# Patient Record
Sex: Male | Born: 1935 | Race: White | Hispanic: No | Smoking: Former smoker
Health system: Southern US, Community
[De-identification: ages and names within clinical notes are randomized; demographics above are authoritative.]

## PROBLEM LIST (undated history)

## (undated) DIAGNOSIS — F32A Depression, unspecified: Secondary | ICD-10-CM

## (undated) DIAGNOSIS — N200 Calculus of kidney: Secondary | ICD-10-CM

## (undated) DIAGNOSIS — C3491 Malignant neoplasm of unspecified part of right bronchus or lung: Principal | ICD-10-CM

## (undated) DIAGNOSIS — M1990 Unspecified osteoarthritis, unspecified site: Secondary | ICD-10-CM

## (undated) DIAGNOSIS — I10 Essential (primary) hypertension: Secondary | ICD-10-CM

## (undated) DIAGNOSIS — F419 Anxiety disorder, unspecified: Secondary | ICD-10-CM

## (undated) DIAGNOSIS — F329 Major depressive disorder, single episode, unspecified: Secondary | ICD-10-CM

## (undated) HISTORY — DX: Calculus of kidney: N20.0

## (undated) HISTORY — DX: Depression, unspecified: F32.A

## (undated) HISTORY — DX: Major depressive disorder, single episode, unspecified: F32.9

## (undated) HISTORY — DX: Malignant neoplasm of unspecified part of right bronchus or lung: C34.91

## (undated) HISTORY — DX: Essential (primary) hypertension: I10

## (undated) HISTORY — DX: Unspecified osteoarthritis, unspecified site: M19.90

## (undated) HISTORY — DX: Anxiety disorder, unspecified: F41.9

## (undated) HISTORY — PX: HERNIA REPAIR: SHX51

---

## 2017-02-11 ENCOUNTER — Other Ambulatory Visit: Payer: Self-pay | Admitting: Medical Oncology

## 2017-02-11 DIAGNOSIS — C3490 Malignant neoplasm of unspecified part of unspecified bronchus or lung: Secondary | ICD-10-CM

## 2017-02-12 ENCOUNTER — Other Ambulatory Visit (HOSPITAL_BASED_OUTPATIENT_CLINIC_OR_DEPARTMENT_OTHER): Payer: Medicare Other

## 2017-02-12 ENCOUNTER — Ambulatory Visit: Payer: Self-pay

## 2017-02-12 ENCOUNTER — Encounter: Payer: Self-pay | Admitting: *Deleted

## 2017-02-12 ENCOUNTER — Ambulatory Visit (HOSPITAL_BASED_OUTPATIENT_CLINIC_OR_DEPARTMENT_OTHER): Payer: Medicare Other | Admitting: Internal Medicine

## 2017-02-12 ENCOUNTER — Encounter: Payer: Self-pay | Admitting: Internal Medicine

## 2017-02-12 ENCOUNTER — Telehealth: Payer: Self-pay | Admitting: Internal Medicine

## 2017-02-12 DIAGNOSIS — J910 Malignant pleural effusion: Secondary | ICD-10-CM

## 2017-02-12 DIAGNOSIS — C3491 Malignant neoplasm of unspecified part of right bronchus or lung: Secondary | ICD-10-CM | POA: Diagnosis present

## 2017-02-12 DIAGNOSIS — C3490 Malignant neoplasm of unspecified part of unspecified bronchus or lung: Secondary | ICD-10-CM

## 2017-02-12 DIAGNOSIS — I10 Essential (primary) hypertension: Secondary | ICD-10-CM

## 2017-02-12 DIAGNOSIS — C3480 Malignant neoplasm of overlapping sites of unspecified bronchus and lung: Secondary | ICD-10-CM

## 2017-02-12 DIAGNOSIS — C7951 Secondary malignant neoplasm of bone: Secondary | ICD-10-CM

## 2017-02-12 HISTORY — DX: Malignant neoplasm of unspecified part of right bronchus or lung: C34.91

## 2017-02-12 LAB — CBC WITH DIFFERENTIAL/PLATELET
BASO%: 0.2 % (ref 0.0–2.0)
BASOS ABS: 0 10*3/uL (ref 0.0–0.1)
EOS ABS: 0.2 10*3/uL (ref 0.0–0.5)
EOS%: 2.5 % (ref 0.0–7.0)
HEMATOCRIT: 39.5 % (ref 38.4–49.9)
HEMOGLOBIN: 12.8 g/dL — AB (ref 13.0–17.1)
LYMPH#: 2.8 10*3/uL (ref 0.9–3.3)
LYMPH%: 44.2 % (ref 14.0–49.0)
MCH: 30.3 pg (ref 27.2–33.4)
MCHC: 32.4 g/dL (ref 32.0–36.0)
MCV: 93.6 fL (ref 79.3–98.0)
MONO#: 0.6 10*3/uL (ref 0.1–0.9)
MONO%: 9.5 % (ref 0.0–14.0)
NEUT%: 43.6 % (ref 39.0–75.0)
NEUTROS ABS: 2.8 10*3/uL (ref 1.5–6.5)
Platelets: 221 10*3/uL (ref 140–400)
RBC: 4.22 10*6/uL (ref 4.20–5.82)
RDW: 14 % (ref 11.0–14.6)
WBC: 6.4 10*3/uL (ref 4.0–10.3)

## 2017-02-12 LAB — COMPREHENSIVE METABOLIC PANEL
ALBUMIN: 3.3 g/dL — AB (ref 3.5–5.0)
ALK PHOS: 108 U/L (ref 40–150)
ALT: 11 U/L (ref 0–55)
ANION GAP: 8 meq/L (ref 3–11)
AST: 18 U/L (ref 5–34)
BUN: 24.2 mg/dL (ref 7.0–26.0)
CALCIUM: 9.4 mg/dL (ref 8.4–10.4)
CO2: 28 mEq/L (ref 22–29)
CREATININE: 1.1 mg/dL (ref 0.7–1.3)
Chloride: 103 mEq/L (ref 98–109)
EGFR: 57 mL/min/{1.73_m2} — ABNORMAL LOW (ref >90)
Glucose: 93 mg/dl (ref 70–140)
POTASSIUM: 4.8 meq/L (ref 3.5–5.1)
Sodium: 138 mEq/L (ref 136–145)
Total Bilirubin: 0.48 mg/dL (ref 0.20–1.20)
Total Protein: 7.3 g/dL (ref 6.4–8.3)

## 2017-02-12 NOTE — Telephone Encounter (Signed)
Gave patient AVS and calendar of upcoming September appointment.

## 2017-02-12 NOTE — Progress Notes (Signed)
[place] [phone]   [phone]  CONSULT NOTE  REFERRING PHYSICIAN: Dr. Mardella Layman  REASON FOR CONSULTATION:  83 years old white male recently diagnosed with lung cancer.  HPI Carlos Bennett is a 83 y.o. male with past medical history significant for hypertension, anxiety/depression, kidney stone, hernia repair as well as a remote history of smoking for a 3 years but quit in 1970s. The patient presented to the [facility] in [facility] complaining of right flank pain. He had CT scan of the abdomen performed on 01/29/2017 and it showed massive right pleural effusion with complete collapse of the visualized portion of the right lung base. There was also innumerable lytic lesions seen throughout the lumbar spine as well as the lower thoracic spine. Lytic lesions were also seen in the iliac bones bilaterally, the right superior pubic ramus and proximal femurs bilaterally. On 01/31/2017 the patient underwent ultrasound-guided right thoracentesis with drainage of 2.2 L of pleural fluid. The final cytology from [facility] pathology department [phone]) showed adenocarcinoma with immunohistochemical findings suggesting origin from a primary lung neoplasm. I'm not sure if any medical or studies were performed on this specimen. CT scan of the chest was performed on 01/31/2017 and it showed small to moderate loculated right pleural effusion with significant improvement since the previous study on 01/29/2017. There was persistent right lower lobe and scattered right lung atelectasis. The scan also showed lytic bony metastases with cortical destruction present within the lower thoracic spine primarily involving T10 and T11 which include soft tissue mass extending posteriorly into the central canal worse at T10. There was also tiny left lung pulmonary nodules measuring 0.4 cm within the sub-pleural left lower lobe. The patient moved to  [facility] to be close to his daughter and he was kindly referred to me for evaluation and discussion of his treatment options. When seen today he continues to have pain in the right lower side of the chest with mild cough productive of whitish sputum but no significant shortness of breath or hemoptysis. He lost around 25 pounds in the last 6 months. He denied having any nausea, vomiting, diarrhea but has constipation. He denied having any fever or chills. The patient denied having any headaches or visual changes. Family history is unknown regarding his father and mother. He has a sister with heart disease and a daughter died from ovarian cancer. The patient is legally separated. He was accompanied today by his daughter, Carlos Bennett who lives in [place]. His son Carlos Bennett lives in [place] and he has the healthcare power of attorney but he also mentions that his sister, Carlos Bennett called neck decision if needed. The patient is currently retired and used to be recommended [employer]. He has a history of smoking for only around 3 years but quit in 1970. He has no history of alcohol or drug abuse.  HPI  Past Medical History:  Diagnosis Date  . Anxiety   . Arthritis   . Depression   . Hypertension   . Kidney stone     Past Surgical History:  Procedure Laterality Date  . HERNIA REPAIR N/A     History reviewed. No pertinent family history.  Social History Social History  Substance Use Topics  . Smoking status: Former Smoker    Packs/day: 1.00    Years: 10.00    Types: Cigarettes  . Smokeless tobacco: Never Used  . Alcohol use Not on file    No Known Allergies  Current  Outpatient Prescriptions  Medication Sig Dispense Refill  . acetaminophen (TYLENOL) 325 MG tablet Take 650 mg by mouth every 6 (six) hours as needed.    Marland Kitchen albuterol (PROVENTIL HFA;VENTOLIN HFA) 108 (90 Base) MCG/ACT inhaler Inhale into the lungs every 6 (six) hours as needed for wheezing or shortness of breath.    Marland Kitchen buPROPion  (ZYBAN) 150 MG 12 hr tablet Take 150 mg by mouth daily.    . hydrALAZINE (APRESOLINE) 25 MG tablet Take 25 mg by mouth 2 (two) times daily.    Marland Kitchen HYDROcodone-acetaminophen (NORCO) 5-325 MG tablet Take 1 tablet by mouth every 4 (four) hours as needed for moderate pain.    Marland Kitchen losartan (COZAAR) 50 MG tablet Take 50 mg by mouth daily.    . mirtazapine (REMERON) 15 MG tablet Take 15 mg by mouth at bedtime.    . diclofenac sodium (VOLTAREN) 1 % GEL Apply 2 g topically 4 (four) times daily.    . hydrocortisone 2.5 % cream Apply topically 2 (two) times daily.     No current facility-administered medications for this visit.     Review of Systems  Constitutional: positive for fatigue and weight loss Eyes: negative Ears, nose, mouth, throat, and face: positive for hearing loss Respiratory: positive for cough and pleurisy/chest pain Cardiovascular: negative Gastrointestinal: negative Genitourinary:negative Integument/breast: negative Hematologic/lymphatic: negative Musculoskeletal:negative Neurological: negative Behavioral/Psych: negative Endocrine: negative Allergic/Immunologic: negative  Physical Exam  FIE:PPIRJ, healthy, no distress, well nourished and well developed SKIN: skin color, texture, turgor are normal, no rashes or significant lesions HEAD: Normocephalic, No masses, lesions, tenderness or abnormalities EYES: normal, PERRLA, Conjunctiva are pink and non-injected EARS: External ears normal, Canals clear OROPHARYNX:no exudate, no erythema and lips, buccal mucosa, and tongue normal  NECK: supple, no adenopathy, no JVD LYMPH:  no palpable lymphadenopathy, no hepatosplenomegaly LUNGS: decreased breath sounds HEART: regular rate & rhythm, no murmurs and no gallops ABDOMEN:abdomen soft, non-tender, normal bowel sounds and no masses or organomegaly BACK: Back symmetric, no curvature., No CVA tenderness EXTREMITIES:no joint deformities, effusion, or inflammation, no edema, no skin  discoloration  NEURO: alert & oriented x 3 with fluent speech, no focal motor/sensory deficits  PERFORMANCE STATUS: ECOG 1  LABORATORY DATA: Lab Results  Component Value Date   WBC 6.4 02/12/2017   HGB 12.8 (L) 02/12/2017   HCT 39.5 02/12/2017   MCV 93.6 02/12/2017   PLT 221 02/12/2017      Chemistry      Component Value Date/Time   NA 138 02/12/2017 1341   K 4.8 02/12/2017 1341   CO2 28 02/12/2017 1341   BUN 24.2 02/12/2017 1341   CREATININE 1.1 02/12/2017 1341      Component Value Date/Time   CALCIUM 9.4 02/12/2017 1341   ALKPHOS 108 02/12/2017 1341   AST 18 02/12/2017 1341   ALT 11 02/12/2017 1341   BILITOT 0.48 02/12/2017 1341       RADIOGRAPHIC STUDIES: No results found.  ASSESSMENT:This is a very pleasant 83 years old white male recently diagnosed with a stage IV (T1a, NX, M1b) non-small cell lung cancer, adenocarcinoma presented with large right malignant pleural effusion in addition to pulmonary nodules and metastatic bone disease diagnosed in August 2018.  PLAN: I have a lengthy discussion with the patient and his daughter as well as his son who was available by phone on his current disease stage, prognosis and treatment options. I personally and independently reviewed the scan images that came from the outside facilities as well as a previous records.  I recommended for the patient to complete the staging workup by ordering a PET scan as well as MRI of the brain to rule out any other metastatic disease. I also recommend for the patient to see cardiothoracic surgery for consideration of Pleurx catheter placement for drainage of the recurrent right malignant pleural effusion and also to have available tissue for molecular studies. I referred the patient to radiation oncology for consideration of palliative radiotherapy to the metastatic disease at T10 and T11 that is extending into the spinal canal. I also requested a blood test for EGFR mutation today. I will  arrange for the patient to come back for follow-up visit in 3 weeks for reevaluation and more detailed discussion about his treatment options. I think the patient would be a good candidate for treatment with targeted therapy or immunotherapy if he has the appropriate markers but he may not be great candidate for chemotherapy if it is the only option. The patient and his family agreed to the current plan. He was advised to call immediately if he has any concerning symptoms in the interval. The patient voices understanding of current disease status and treatment options and is in agreement with the current care plan. All questions were answered. The patient knows to call the clinic with any problems, questions or concerns. We can certainly see the patient much sooner if necessary.  Thank you so much for allowing me to participate in the care of Carlos Bennett. I will continue to follow up the patient with you and assist in his care.  I spent 55 minutes counseling the patient face to face. The total time spent in the appointment was 80 minutes.  Disclaimer: This note was dictated with voice recognition software. Similar sounding words can inadvertently be transcribed and may not be corrected upon review.   Eilleen Kempf February 12, 2017, 2:39 PM

## 2017-02-13 ENCOUNTER — Encounter: Payer: Self-pay | Admitting: Radiation Oncology

## 2017-02-18 ENCOUNTER — Other Ambulatory Visit: Payer: Self-pay | Admitting: Internal Medicine

## 2017-02-18 ENCOUNTER — Inpatient Hospital Stay
Admission: RE | Admit: 2017-02-18 | Discharge: 2017-02-18 | Disposition: A | Discharge status: Home / Self Care | Payer: Self-pay | Source: Ambulatory Visit | Attending: Internal Medicine | Admitting: Internal Medicine

## 2017-02-18 ENCOUNTER — Ambulatory Visit
Admission: RE | Admit: 2017-02-18 | Discharge: 2017-02-18 | Disposition: A | Discharge status: Home / Self Care | Payer: Self-pay | Source: Ambulatory Visit | Attending: Internal Medicine | Admitting: Internal Medicine

## 2017-02-18 ENCOUNTER — Encounter: Payer: Self-pay | Admitting: *Deleted

## 2017-02-18 ENCOUNTER — Other Ambulatory Visit: Payer: Self-pay | Admitting: *Deleted

## 2017-02-18 ENCOUNTER — Telehealth: Payer: Self-pay | Admitting: *Deleted

## 2017-02-18 ENCOUNTER — Encounter: Payer: Self-pay | Admitting: Radiation Oncology

## 2017-02-18 DIAGNOSIS — C7951 Secondary malignant neoplasm of bone: Secondary | ICD-10-CM | POA: Insufficient documentation

## 2017-02-18 DIAGNOSIS — C801 Malignant (primary) neoplasm, unspecified: Secondary | ICD-10-CM

## 2017-02-18 LAB — EPIDERMAL GROWTH FACTOR RECEPTOR (EGFR) MUTATION ANALYSIS

## 2017-02-18 MED ORDER — HYDROCODONE-ACETAMINOPHEN 5-325 MG PO TABS: 1.0000 | ORAL_TABLET | ORAL | 0 refills | Status: DC | PRN

## 2017-02-18 NOTE — Progress Notes (Signed)
Oncology Nurse Navigator Documentation  Oncology Nurse Navigator Flowsheets 02/18/2017  Navigator Location [facility]  Navigator Encounter Type Other/Took CD's took radiology to be uploaded in PACS  Treatment Phase Pre-Tx/Tx Discussion  Barriers/Navigation Needs Coordination of Care  Interventions Coordination of Care  Coordination of Care Other  Acuity Level 2  Acuity Level 2 Other

## 2017-02-18 NOTE — Telephone Encounter (Signed)
Called [facility] for Carlos Bennett, Rx for pain medicine can be picked up tomorrow after 0830.

## 2017-02-18 NOTE — Progress Notes (Signed)
Histology and Location of Primary Cancer: stage IV (T1a, NX, M1b) non-small cell lung cancer, adenocarcinoma presented with large right malignant pleural effusion in addition to pulmonary nodules and metastatic bone disease diagnosed in August 2018.  Location(s) of Symptomatic Metastases: T10 and T11  Past/Anticipated chemotherapy by medical oncology, if any:  Ordered MRI of brain and PET to be done 02/26/17. Referred to cardiothoracic surgery for consideration of Pleurx catheter. Julien Nordmann believes the patient would be a good candidate for treatment with targeted therapy or immunotherapy if he has the appropriate markers but he may not be great candidate for chemotherapy if it is the only option.  Pain on a scale of 0-10 is: Right lower side of chest which radiates to center of abdomen 6-10 on a scale of 0-10 reports taking 1/2 - 1 tablet of norco q4h to manage pain   If Spine Met(s), symptoms, if any, include:  Bowel/Bladder retention or incontinence (please describe): Reports bowel incontinence last night but, normally constipation.  Numbness or weakness in extremities (please describe): Reports intermittent numbness in hands  Current Decadron regimen, if applicable: denies  Ambulatory status? Walker? Wheelchair?: Ambulatory  SAFETY ISSUES:  Prior radiation? no  Pacemaker/ICD? no  Possible current pregnancy? no  Is the patient on methotrexate? no  Current Complaints / other details:  83 year old male. Moved to [place] from [place] to be closer to his daughter.   Reports a mild cough productive with thick, sticky, white sputum. Denies significant SOB. Denies hemoptysis. Reports he has lost 25 lbs in 6 months. Daughter reports he is now gaining weight and eating more. Reports he is occasionally off balance. Reports constipation.  Scheduled to consult with Roxan Hockey about draining right pleural effusion on 03/04/2017.

## 2017-02-18 NOTE — Telephone Encounter (Signed)
ok 

## 2017-02-18 NOTE — Telephone Encounter (Signed)
Patient's daughter [name] called requesting refill for pain medication.  Return number when ready for pick up is [phone]).

## 2017-02-19 ENCOUNTER — Ambulatory Visit
Admission: RE | Admit: 2017-02-19 | Discharge: 2017-02-19 | Disposition: A | Discharge status: Home / Self Care | Payer: Medicare Other | Source: Ambulatory Visit | Attending: Radiation Oncology | Admitting: Radiation Oncology

## 2017-02-19 ENCOUNTER — Ambulatory Visit
Admission: RE | Admit: 2017-02-19 | Discharge: 2017-02-19 | Disposition: A | Discharge status: Home / Self Care | Payer: Self-pay | Source: Ambulatory Visit | Attending: Internal Medicine | Admitting: Internal Medicine

## 2017-02-19 ENCOUNTER — Encounter: Payer: Self-pay | Admitting: Radiation Oncology

## 2017-02-19 ENCOUNTER — Other Ambulatory Visit: Payer: Self-pay | Admitting: Internal Medicine

## 2017-02-19 ENCOUNTER — Other Ambulatory Visit: Payer: Self-pay | Admitting: Medical Oncology

## 2017-02-19 VITALS — BP 144/66 | HR 79 | Temp 97.9°F | Resp 16 | Ht 66.5 in | Wt 132.4 lb

## 2017-02-19 DIAGNOSIS — F329 Major depressive disorder, single episode, unspecified: Secondary | ICD-10-CM | POA: Insufficient documentation

## 2017-02-19 DIAGNOSIS — Z87891 Personal history of nicotine dependence: Secondary | ICD-10-CM | POA: Insufficient documentation

## 2017-02-19 DIAGNOSIS — Z79899 Other long term (current) drug therapy: Secondary | ICD-10-CM | POA: Insufficient documentation

## 2017-02-19 DIAGNOSIS — I10 Essential (primary) hypertension: Secondary | ICD-10-CM | POA: Insufficient documentation

## 2017-02-19 DIAGNOSIS — C3491 Malignant neoplasm of unspecified part of right bronchus or lung: Secondary | ICD-10-CM | POA: Diagnosis not present

## 2017-02-19 DIAGNOSIS — Z87442 Personal history of urinary calculi: Secondary | ICD-10-CM | POA: Diagnosis not present

## 2017-02-19 DIAGNOSIS — C7951 Secondary malignant neoplasm of bone: Secondary | ICD-10-CM

## 2017-02-19 DIAGNOSIS — J910 Malignant pleural effusion: Secondary | ICD-10-CM | POA: Insufficient documentation

## 2017-02-19 DIAGNOSIS — F419 Anxiety disorder, unspecified: Secondary | ICD-10-CM | POA: Insufficient documentation

## 2017-02-19 DIAGNOSIS — Z9889 Other specified postprocedural states: Secondary | ICD-10-CM | POA: Diagnosis not present

## 2017-02-19 DIAGNOSIS — C801 Malignant (primary) neoplasm, unspecified: Secondary | ICD-10-CM

## 2017-02-19 DIAGNOSIS — Z510 Encounter for antineoplastic radiation therapy: Secondary | ICD-10-CM | POA: Insufficient documentation

## 2017-02-19 DIAGNOSIS — Z8041 Family history of malignant neoplasm of ovary: Secondary | ICD-10-CM | POA: Diagnosis not present

## 2017-02-19 DIAGNOSIS — Z79891 Long term (current) use of opiate analgesic: Secondary | ICD-10-CM | POA: Insufficient documentation

## 2017-02-19 DIAGNOSIS — C7952 Secondary malignant neoplasm of bone marrow: Principal | ICD-10-CM

## 2017-02-19 NOTE — Progress Notes (Signed)
Radiation Oncology         [phone] ________________________________  Initial Outpatient Consultation  Name: Carlos Bennett MRN: [id number]  Date of Service: 02/19/2017 DOB: 04-04-1924  DE:YCXKGYJ, No Pcp Per  Curt Bears, MD   REFERRING PHYSICIAN: Curt Bears, MD  DIAGNOSIS: 83 y.o. gentleman with T10 and T11 metastases from stage IV (T1a, NX, M1b) adenocarcinoma of the right lower lung    ICD-10-CM   1. Adenocarcinoma of right lung, stage 4 (HCC) C34.91   2. 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 male seen at the request of Dr. Julien Nordmann.  He was recently diagnosed with stage IV (T1a, NX, M1b) non-small cell lung cancer, adenocarcinoma. He presented with a large right malignant pleural effusion in addition to pulmonary nodules and metastatic bone disease in August 2018.  The patient presented to the [facility] in [place], [place] complaining of right flank pain. He had a CT scan of the abdomen performed on 01/29/2017 which showed a massive right pleural effusion with complete collapse of the visualized portion of the right lung base. There was also innumerable lytic lesions seen throughout the thoracic and lumbar spine as well as the iliac bones bilaterally, the right superior pubic ramus, and proximal femurs bilaterally.   On 01/31/2017 the patient underwent ultrasound-guided right thoracentesis with drainage of 2.2 L of pleural fluid. The final cytology from [facility] pathology department [id number]) showed adenocarcinoma with immunohistochemical findings suggesting origin from a primary lung neoplasm.    Follow up CT scan of the chest was performed on 01/31/2017 revealing a small to moderate loculated right pleural effusion with significant improvement since the previous study on 01/29/2017. There was persistent right lower lobe and scattered right lung atelectasis. The scan also showed lytic bony  metastases with cortical destruction present within the lower thoracic spine primarily involving T10 and T11 which include soft tissue mass extending posteriorly into the central canal, worse at T10. Also noted were tiny left lung pulmonary nodules measuring 0.4 cm within the sub-pleural left lower lobe.  He is scheduled for further disease staging with PET scan and Brain MRI on 02/26/2017. He is also scheduled to consult with Dr. Roxan Hockey about draining right pleural effusion on 03/04/2017.  He is referred to radiation oncology for consideration of palliative radiotherapy to the thoracic spine at T10-T11. He is accompanied today by his daughter.  PREVIOUS RADIATION THERAPY: No  PAST MEDICAL HISTORY:  Past Medical History:  Diagnosis Date  . Adenocarcinoma of right lung, stage 4 ([place]) 02/12/2017  . Anxiety   . Arthritis   . Depression   . Hypertension   . Kidney stone       PAST SURGICAL HISTORY: Past Surgical History:  Procedure Laterality Date  . HERNIA REPAIR N/A     FAMILY HISTORY:  Family History  Problem Relation Age of Onset  . Cancer Daughter        ovarian    SOCIAL HISTORY:  Social History   Social History  . Marital status: Legally Separated    Spouse name: N/A  . Number of children: N/A  . Years of education: N/A   Occupational History  . Not on file.   Social History Main Topics  . Smoking status: Former Smoker    Packs/day: 1.00    Years: 10.00    Types: Cigarettes    Quit date: 06/17/1970  . Smokeless tobacco: Never Used  . Alcohol use  No  . Drug use: No  . Sexual activity: No   Other Topics Concern  . Not on file   Social History Narrative  . No narrative on file    ALLERGIES: Patient has no known allergies.  MEDICATIONS:  Current Outpatient Prescriptions  Medication Sig Dispense Refill  . acetaminophen (TYLENOL) 325 MG tablet Take 650 mg by mouth every 6 (six) hours as needed.    Marland Kitchen albuterol (PROVENTIL HFA;VENTOLIN HFA) 108 (90  Base) MCG/ACT inhaler Inhale into the lungs every 6 (six) hours as needed for wheezing or shortness of breath.    Marland Kitchen buPROPion (ZYBAN) 150 MG 12 hr tablet Take 150 mg by mouth daily.    . diclofenac sodium (VOLTAREN) 1 % GEL Apply 2 g topically 4 (four) times daily.    . hydrALAZINE (APRESOLINE) 25 MG tablet Take 25 mg by mouth 2 (two) times daily.    Marland Kitchen HYDROcodone-acetaminophen (NORCO) 5-325 MG tablet Take 1 tablet by mouth every 4 (four) hours as needed for moderate pain. 30 tablet 0  . hydrocortisone 2.5 % cream Apply topically 2 (two) times daily.    Marland Kitchen losartan (COZAAR) 50 MG tablet Take 50 mg by mouth daily.    . mirtazapine (REMERON) 15 MG tablet Take 15 mg by mouth at bedtime.     No current facility-administered medications for this encounter.     REVIEW OF SYSTEMS:  On review of systems, the patient reports that he is doing well overall. He denies any increased shortness of breath, fevers, chills, or night sweats. He reports a mild productive cough with thick, sticky, white sputum. He denies hemoptysis. He has fatigue and occasionally feels "off balance". He reports a 25-30 pound unintended weight loss over the past 6 months. His daughter explains that he is now starting to gain weight, has a good appetite, and is able to complete daily activities and regular exercise. He reports 6/10 pain to the right flank that radiates around to the center of his abdomen, managed with 1/2-1 tablet of norco taken every 4 hours. He denies any bladder disturbances, and denies nausea or vomiting. He reports recent intermittent episodes of bowel incontinence, ongoing for the past several months, where the stool seeps out and he is completely unaware.  Since starting narcotic pain medication, he has developed some constipation, managed with Miralax and prune juice daily. He experiences pain in his upper right leg with ambulation. He reports intermittent numbness and tingling in his hands and feet. He denies any  recent falls or difficulty walking.  He denies any weakness in the upper or lower extremities.  A complete review of systems is obtained and is otherwise negative.  PHYSICAL EXAM:  Wt Readings from Last 3 Encounters:  02/19/17 132 lb 6.4 oz (60.1 kg)  02/12/17 131 lb 12.8 oz (59.8 kg)   Temp Readings from Last 3 Encounters:  02/19/17 97.9 F (36.6 C) (Oral)  02/12/17 98.1 F (36.7 C) (Oral)   BP Readings from Last 3 Encounters:  02/19/17 (!) 144/66  02/12/17 (!) 155/78   Pulse Readings from Last 3 Encounters:  02/19/17 79  02/12/17 67   Pain Assessment Pain Score: 6  (right lower side of chest)/10  In general this is a well appearing Caucasian male in no acute distress. He is alert and oriented x4 and appropriate throughout the examination. Cardiovascular exam reveals a regular rate and rhythm, no clicks rubs or murmurs are auscultated. Chest is clear to auscultation bilaterally with decreased breath sounds on  the right. Lymphatic assessment is performed and does not reveal any adenopathy in the cervical, supraclavicular, axillary, or inguinal chains. Abdomen has active bowel sounds in all quadrants and is intact. The abdomen is soft, non tender, non distended. Lower extremities are negative for pretibial pitting edema, deep calf tenderness, cyanosis or clubbing. Strength is 5/5 and equal in the bilateral upper and lower extremities.  Sensation is intact to light touch in the upper and lower extremities.  Gait is normal.  KPS = 90  100 - Normal; no complaints; no evidence of disease. 90   - Able to carry on normal activity; minor signs or symptoms of disease. 80   - Normal activity with effort; some signs or symptoms of disease. 19   - Cares for self; unable to carry on normal activity or to do active work. 60   - Requires occasional assistance, but is able to care for most of his personal needs. 50   - Requires considerable assistance and frequent medical care. 76   - Disabled;  requires special care and assistance. 52   - Severely disabled; hospital admission is indicated although death not imminent. 32   - Very sick; hospital admission necessary; active supportive treatment necessary. 10   - Moribund; fatal processes progressing rapidly. 0     - Dead  Karnofsky DA, Abelmann Williams, Craver LS and Burchenal JH [phone]) The use of the nitrogen mustards in the palliative treatment of carcinoma: with particular reference to bronchogenic carcinoma Cancer 1 634-56  LABORATORY DATA:  Lab Results  Component Value Date   WBC 6.4 02/12/2017   HGB 12.8 (L) 02/12/2017   HCT 39.5 02/12/2017   MCV 93.6 02/12/2017   PLT 221 02/12/2017   Lab Results  Component Value Date   NA 138 02/12/2017   K 4.8 02/12/2017   CO2 28 02/12/2017   Lab Results  Component Value Date   ALT 11 02/12/2017   AST 18 02/12/2017   ALKPHOS 108 02/12/2017   BILITOT 0.48 02/12/2017     RADIOGRAPHY: Dg Outside Films Chest  Result Date: 02/18/2017 This examination belongs to an outside facility and is stored here for comparison purposes only.  Contact the originating outside institution for any associated report or interpretation.     IMPRESSION/PLAN: 60. 83 y.o. gentleman with recently diagnosed stage IV (T1a, NX, M1b) non-small cell lung cancer, adenocarcinoma presented with large right malignant pleural effusion in addition to pulmonary nodules and metastatic bone disease diagnosed in August 2018. Today, we talked to the patient and his daughter about the findings and workup thus far. We discussed the natural history of metastatic carcinoma and general treatment, highlighting the role of radiotherapy in the management. We discussed the available radiation techniques, and focused on the details of logistics and delivery. We reviewed the anticipated acute and late sequelae associated with radiation in this setting. The patient was encouraged to ask questions that were answered to his satisfaction.  The  patient would like to proceed with palliative radiation to the spine and is scheduled for CT simulation today in anticipation of starting radiotherapy tomorrow 02/20/2017.  We spent 60 minutes minutes face to face with the patient and more than 50% of that time was spent in counseling and/or coordination of care.    Nicholos Johns, PA-C    Tyler Pita, MD  [place] Direct Dial: [phone]  Fax: [phone]   Page Me   This document serves as a record of services personally  performed by Tyler Pita, MD and Freeman Caldron, PA-C. It was created on their behalf by Rae Lips, a trained medical scribe. The creation of this record is based on the scribe's personal observations and the providers' statements to them. This document has been checked and approved by the attending providers.

## 2017-02-19 NOTE — Progress Notes (Signed)
See progress note under physician encounter. 

## 2017-02-19 NOTE — Progress Notes (Signed)
  Radiation Oncology         [phone]) [phone] ________________________________  Name: Carlos Bennett MRN: [id number]  Date: 02/19/2017  DOB: [birth year]  SIMULATION AND TREATMENT PLANNING NOTE    ICD-10-CM   1. 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 gentleman with recently diagnosed stage IV (T1a, NX, M1b) non-small cell lung cancer, adenocarcinoma with T10 and T11 metastases  NARRATIVE:  The patient was brought to the [facility].  Identity was confirmed.  All relevant records and images related to the planned course of therapy were reviewed.  The patient freely provided informed written consent to proceed with treatment after reviewing the details related to the planned course of therapy. The consent form was witnessed and verified by the simulation staff.  Then, the patient was set-up in a stable reproducible  supine position for radiation therapy.  CT images were obtained.  Surface markings were placed.  The CT images were loaded into the planning software.  Then the target and avoidance structures were contoured.  Treatment planning then occurred.  The radiation prescription was entered and confirmed.  Then, I designed and supervised the construction of a total of 2 medically necessary complex treatment devices consisting of MLC's to shield kidneys, lungs and heart.  I have requested : 3D Simulation  I have requested a DVH of the following structures: left kidney, right kidney, heart, left lung, right lung and targets.   PLAN:  The patient will receive 30 Gy in 10 fraction.  ________________________________  Sheral Apley Tammi Klippel, M.D.

## 2017-02-20 ENCOUNTER — Ambulatory Visit
Admission: RE | Admit: 2017-02-20 | Discharge: 2017-02-20 | Disposition: A | Discharge status: Home / Self Care | Payer: Medicare Other | Source: Ambulatory Visit | Attending: Radiation Oncology | Admitting: Radiation Oncology

## 2017-02-20 DIAGNOSIS — Z510 Encounter for antineoplastic radiation therapy: Secondary | ICD-10-CM | POA: Diagnosis not present

## 2017-02-21 ENCOUNTER — Telehealth: Payer: Self-pay

## 2017-02-21 ENCOUNTER — Ambulatory Visit
Admission: RE | Admit: 2017-02-21 | Discharge: 2017-02-21 | Disposition: A | Discharge status: Home / Self Care | Payer: Medicare Other | Source: Ambulatory Visit | Attending: Radiation Oncology | Admitting: Radiation Oncology

## 2017-02-21 DIAGNOSIS — Z510 Encounter for antineoplastic radiation therapy: Secondary | ICD-10-CM | POA: Diagnosis not present

## 2017-02-21 NOTE — Telephone Encounter (Signed)
Daughter called that pt is having stomach pain. His LBM was 5 days ago. He has been using 1 dose miralax daily and prune juice. She has been making sure he is eating high fiber foods. Discussed using glycerin suppository, magnesium citrate 1/2 bottle and if no results in 4 hours use the other 1/2 bottle. Also discussed using more miralax and colace to keep bowels regulated. Pt is using pain medications and getting XRT to T10, 11 area.  If he is still in pain and no BM and wants to see [facility], she needs to get pt here by about 130, before his XRT appt today at 3pm.

## 2017-02-24 ENCOUNTER — Ambulatory Visit
Admission: RE | Admit: 2017-02-24 | Discharge: 2017-02-24 | Disposition: A | Discharge status: Home / Self Care | Payer: Medicare Other | Source: Ambulatory Visit | Attending: Radiation Oncology | Admitting: Radiation Oncology

## 2017-02-24 DIAGNOSIS — Z510 Encounter for antineoplastic radiation therapy: Secondary | ICD-10-CM | POA: Diagnosis not present

## 2017-02-25 ENCOUNTER — Ambulatory Visit
Admission: RE | Admit: 2017-02-25 | Discharge: 2017-02-25 | Disposition: A | Discharge status: Home / Self Care | Payer: Medicare Other | Source: Ambulatory Visit | Attending: Radiation Oncology | Admitting: Radiation Oncology

## 2017-02-25 DIAGNOSIS — Z510 Encounter for antineoplastic radiation therapy: Secondary | ICD-10-CM | POA: Diagnosis not present

## 2017-02-26 ENCOUNTER — Other Ambulatory Visit: Payer: Self-pay | Admitting: Internal Medicine

## 2017-02-26 ENCOUNTER — Ambulatory Visit (HOSPITAL_COMMUNITY)
Admission: RE | Admit: 2017-02-26 | Discharge: 2017-02-26 | Disposition: A | Discharge status: Home / Self Care | Payer: Medicare Other | Source: Ambulatory Visit | Attending: Internal Medicine | Admitting: Internal Medicine

## 2017-02-26 ENCOUNTER — Encounter (HOSPITAL_COMMUNITY)
Admission: RE | Admit: 2017-02-26 | Discharge: 2017-02-26 | Disposition: A | Discharge status: Home / Self Care | Payer: Medicare Other | Source: Ambulatory Visit | Attending: Internal Medicine | Admitting: Internal Medicine

## 2017-02-26 ENCOUNTER — Ambulatory Visit
Admission: RE | Admit: 2017-02-26 | Discharge: 2017-02-26 | Disposition: A | Discharge status: Home / Self Care | Payer: Medicare Other | Source: Ambulatory Visit | Attending: Radiation Oncology | Admitting: Radiation Oncology

## 2017-02-26 DIAGNOSIS — N400 Enlarged prostate without lower urinary tract symptoms: Secondary | ICD-10-CM | POA: Insufficient documentation

## 2017-02-26 DIAGNOSIS — Z510 Encounter for antineoplastic radiation therapy: Secondary | ICD-10-CM | POA: Diagnosis not present

## 2017-02-26 DIAGNOSIS — C7951 Secondary malignant neoplasm of bone: Secondary | ICD-10-CM

## 2017-02-26 DIAGNOSIS — C3491 Malignant neoplasm of unspecified part of right bronchus or lung: Secondary | ICD-10-CM

## 2017-02-26 DIAGNOSIS — I2510 Atherosclerotic heart disease of native coronary artery without angina pectoris: Secondary | ICD-10-CM | POA: Insufficient documentation

## 2017-02-26 DIAGNOSIS — C3490 Malignant neoplasm of unspecified part of unspecified bronchus or lung: Secondary | ICD-10-CM | POA: Diagnosis present

## 2017-02-26 DIAGNOSIS — I700 Atherosclerosis of aorta: Secondary | ICD-10-CM | POA: Insufficient documentation

## 2017-02-26 DIAGNOSIS — I714 Abdominal aortic aneurysm, without rupture: Secondary | ICD-10-CM | POA: Diagnosis not present

## 2017-02-26 DIAGNOSIS — K5900 Constipation, unspecified: Secondary | ICD-10-CM | POA: Insufficient documentation

## 2017-02-26 DIAGNOSIS — K5730 Diverticulosis of large intestine without perforation or abscess without bleeding: Secondary | ICD-10-CM | POA: Diagnosis not present

## 2017-02-26 DIAGNOSIS — J910 Malignant pleural effusion: Secondary | ICD-10-CM | POA: Insufficient documentation

## 2017-02-26 LAB — GLUCOSE, CAPILLARY: GLUCOSE-CAPILLARY: 110 mg/dL — AB (ref 65–99)

## 2017-02-26 MED ORDER — FLUDEOXYGLUCOSE F - 18 (FDG) INJECTION
6.8000 | Freq: Once | INTRAVENOUS | Status: AC | PRN
  Administered 2017-02-26: 6.8 via INTRAVENOUS

## 2017-02-26 MED ORDER — GADOBENATE DIMEGLUMINE 529 MG/ML IV SOLN
10.0000 mL | Freq: Once | INTRAVENOUS | Status: AC | PRN
  Administered 2017-02-26: 10 mL via INTRAVENOUS

## 2017-02-27 ENCOUNTER — Ambulatory Visit
Admission: RE | Admit: 2017-02-27 | Discharge: 2017-02-27 | Disposition: A | Discharge status: Home / Self Care | Payer: Medicare Other | Source: Ambulatory Visit | Attending: Radiation Oncology | Admitting: Radiation Oncology

## 2017-02-28 ENCOUNTER — Ambulatory Visit
Admission: RE | Admit: 2017-02-28 | Discharge: 2017-02-28 | Disposition: A | Discharge status: Home / Self Care | Payer: Medicare Other | Source: Ambulatory Visit | Attending: Radiation Oncology | Admitting: Radiation Oncology

## 2017-02-28 ENCOUNTER — Ambulatory Visit (HOSPITAL_COMMUNITY)
Admission: RE | Admit: 2017-02-28 | Discharge: 2017-02-28 | Disposition: A | Discharge status: Home / Self Care | Payer: Medicare Other | Source: Ambulatory Visit | Attending: Internal Medicine | Admitting: Internal Medicine

## 2017-02-28 DIAGNOSIS — J9819 Other pulmonary collapse: Secondary | ICD-10-CM | POA: Insufficient documentation

## 2017-02-28 DIAGNOSIS — R938 Abnormal findings on diagnostic imaging of other specified body structures: Secondary | ICD-10-CM

## 2017-02-28 DIAGNOSIS — R0600 Dyspnea, unspecified: Secondary | ICD-10-CM | POA: Diagnosis not present

## 2017-02-28 DIAGNOSIS — J90 Pleural effusion, not elsewhere classified: Secondary | ICD-10-CM | POA: Insufficient documentation

## 2017-02-28 DIAGNOSIS — C3491 Malignant neoplasm of unspecified part of right bronchus or lung: Secondary | ICD-10-CM

## 2017-02-28 DIAGNOSIS — C7951 Secondary malignant neoplasm of bone: Secondary | ICD-10-CM

## 2017-02-28 MED ORDER — GADOBENATE DIMEGLUMINE 529 MG/ML IV SOLN
15.0000 mL | Freq: Once | INTRAVENOUS | Status: AC | PRN
  Administered 2017-02-28: 12 mL via INTRAVENOUS

## 2017-03-01 NOTE — Progress Notes (Signed)
  Radiation Oncology         [phone]) [phone] ________________________________  Name: Carlos Bennett MRN: [id number]  Date: 02/28/2017  DOB: June 25, 1923  Chart Note:  I reviewed this patient's most recent findings and wanted to take a minute to document my impression.  MRI was reviewed and shows extension of tumor into spinal canal at T10 without cord compression.  On comparison to current radiation distribution, the site of concern is receiving radiation therapy, to prevent spinal cord injury.   Current MRI      Current Radiation Treatment              ________________________________  Sheral Apley. Tammi Klippel, M.D.

## 2017-03-02 ENCOUNTER — Emergency Department (HOSPITAL_COMMUNITY): Payer: Medicare Other

## 2017-03-02 ENCOUNTER — Encounter (HOSPITAL_COMMUNITY): Payer: Self-pay | Admitting: Emergency Medicine

## 2017-03-02 ENCOUNTER — Inpatient Hospital Stay (HOSPITAL_COMMUNITY)
Admission: EM | Admit: 2017-03-02 | Discharge: 2017-03-05 | DRG: 180 | Disposition: A | Discharge status: Hospice | Payer: Medicare Other | Attending: Internal Medicine | Admitting: Internal Medicine

## 2017-03-02 ENCOUNTER — Other Ambulatory Visit: Payer: Self-pay

## 2017-03-02 DIAGNOSIS — C3491 Malignant neoplasm of unspecified part of right bronchus or lung: Principal | ICD-10-CM | POA: Diagnosis present

## 2017-03-02 DIAGNOSIS — F329 Major depressive disorder, single episode, unspecified: Secondary | ICD-10-CM | POA: Diagnosis present

## 2017-03-02 DIAGNOSIS — J90 Pleural effusion, not elsewhere classified: Secondary | ICD-10-CM

## 2017-03-02 DIAGNOSIS — F419 Anxiety disorder, unspecified: Secondary | ICD-10-CM | POA: Diagnosis present

## 2017-03-02 DIAGNOSIS — I10 Essential (primary) hypertension: Secondary | ICD-10-CM | POA: Diagnosis present

## 2017-03-02 DIAGNOSIS — R627 Adult failure to thrive: Secondary | ICD-10-CM | POA: Diagnosis present

## 2017-03-02 DIAGNOSIS — K5903 Drug induced constipation: Secondary | ICD-10-CM

## 2017-03-02 DIAGNOSIS — C7951 Secondary malignant neoplasm of bone: Secondary | ICD-10-CM | POA: Diagnosis present

## 2017-03-02 DIAGNOSIS — J910 Malignant pleural effusion: Secondary | ICD-10-CM | POA: Diagnosis present

## 2017-03-02 DIAGNOSIS — G893 Neoplasm related pain (acute) (chronic): Secondary | ICD-10-CM | POA: Diagnosis present

## 2017-03-02 DIAGNOSIS — R0600 Dyspnea, unspecified: Secondary | ICD-10-CM | POA: Diagnosis present

## 2017-03-02 DIAGNOSIS — I480 Paroxysmal atrial fibrillation: Secondary | ICD-10-CM | POA: Diagnosis present

## 2017-03-02 DIAGNOSIS — Z23 Encounter for immunization: Secondary | ICD-10-CM | POA: Diagnosis present

## 2017-03-02 DIAGNOSIS — Z66 Do not resuscitate: Secondary | ICD-10-CM | POA: Diagnosis present

## 2017-03-02 DIAGNOSIS — R0602 Shortness of breath: Secondary | ICD-10-CM | POA: Diagnosis not present

## 2017-03-02 DIAGNOSIS — E43 Unspecified severe protein-calorie malnutrition: Secondary | ICD-10-CM | POA: Diagnosis present

## 2017-03-02 DIAGNOSIS — Z79899 Other long term (current) drug therapy: Secondary | ICD-10-CM | POA: Diagnosis not present

## 2017-03-02 DIAGNOSIS — Z6821 Body mass index (BMI) 21.0-21.9, adult: Secondary | ICD-10-CM

## 2017-03-02 DIAGNOSIS — Z515 Encounter for palliative care: Secondary | ICD-10-CM | POA: Diagnosis not present

## 2017-03-02 DIAGNOSIS — I4891 Unspecified atrial fibrillation: Secondary | ICD-10-CM

## 2017-03-02 LAB — CBC
HCT: 40.3 % (ref 39.0–52.0)
Hemoglobin: 13.6 g/dL (ref 13.0–17.0)
MCH: 30.8 pg (ref 26.0–34.0)
MCHC: 33.7 g/dL (ref 30.0–36.0)
MCV: 91.4 fL (ref 78.0–100.0)
PLATELETS: 291 10*3/uL (ref 150–400)
RBC: 4.41 MIL/uL (ref 4.22–5.81)
RDW: 13.8 % (ref 11.5–15.5)
WBC: 6 10*3/uL (ref 4.0–10.5)

## 2017-03-02 LAB — POCT I-STAT TROPONIN I: Troponin i, poc: 0.01 ng/mL (ref 0.00–0.08)

## 2017-03-02 LAB — BASIC METABOLIC PANEL
Anion gap: 8 (ref 5–15)
BUN: 18 mg/dL (ref 6–20)
CO2: 27 mmol/L (ref 22–32)
CREATININE: 0.99 mg/dL (ref 0.61–1.24)
Calcium: 8.8 mg/dL — ABNORMAL LOW (ref 8.9–10.3)
Chloride: 100 mmol/L — ABNORMAL LOW (ref 101–111)
GFR calc Af Amer: >60 mL/min (ref >60)
Glucose, Bld: 99 mg/dL (ref 65–99)
Potassium: 4 mmol/L (ref 3.5–5.1)
SODIUM: 135 mmol/L (ref 135–145)

## 2017-03-02 MED ORDER — BUPROPION HCL ER (SMOKING DET) 150 MG PO TB12
150.0000 mg | ORAL_TABLET | Freq: Every day | ORAL | Status: DC
  Administered 2017-03-03: 150 mg via ORAL
  Filled 2017-03-02 (×3): qty 1

## 2017-03-02 MED ORDER — MORPHINE SULFATE (PF) 4 MG/ML IV SOLN
4.0000 mg | Freq: Once | INTRAVENOUS | Status: AC
  Administered 2017-03-02: 4 mg via INTRAVENOUS
  Filled 2017-03-02: qty 1

## 2017-03-02 MED ORDER — ALBUTEROL SULFATE (2.5 MG/3ML) 0.083% IN NEBU: 3.0000 mL | INHALATION_SOLUTION | Freq: Four times a day (QID) | RESPIRATORY_TRACT | Status: DC | PRN

## 2017-03-02 MED ORDER — LOSARTAN POTASSIUM 50 MG PO TABS
50.0000 mg | ORAL_TABLET | Freq: Every day | ORAL | Status: DC
  Administered 2017-03-03 – 2017-03-05 (×3): 50 mg via ORAL
  Filled 2017-03-02 (×3): qty 1

## 2017-03-02 MED ORDER — MIRTAZAPINE 15 MG PO TABS
15.0000 mg | ORAL_TABLET | Freq: Every day | ORAL | Status: DC
  Administered 2017-03-02 – 2017-03-04 (×3): 15 mg via ORAL
  Filled 2017-03-02 (×3): qty 1
  Filled 2017-03-02: qty 0.5

## 2017-03-02 MED ORDER — HYDRALAZINE HCL 25 MG PO TABS
25.0000 mg | ORAL_TABLET | Freq: Two times a day (BID) | ORAL | Status: DC
  Administered 2017-03-02 – 2017-03-04 (×4): 25 mg via ORAL
  Filled 2017-03-02 (×4): qty 1

## 2017-03-02 MED ORDER — MORPHINE SULFATE (PF) 4 MG/ML IV SOLN: 1.0000 mg | INTRAVENOUS | Status: DC | PRN

## 2017-03-02 MED ORDER — DEXTROMETHORPHAN POLISTIREX ER 30 MG/5ML PO SUER
7.5000 mg | Freq: Four times a day (QID) | ORAL | Status: DC | PRN
  Filled 2017-03-02: qty 5

## 2017-03-02 MED ORDER — HEPARIN SODIUM (PORCINE) 5000 UNIT/ML IJ SOLN
5000.0000 [IU] | Freq: Three times a day (TID) | INTRAMUSCULAR | Status: DC
  Administered 2017-03-02 – 2017-03-03 (×4): 5000 [IU] via SUBCUTANEOUS
  Filled 2017-03-02 (×4): qty 1

## 2017-03-02 MED ORDER — ONDANSETRON HCL 4 MG/2ML IJ SOLN
4.0000 mg | Freq: Once | INTRAMUSCULAR | Status: AC
  Administered 2017-03-02: 4 mg via INTRAVENOUS
  Filled 2017-03-02: qty 2

## 2017-03-02 MED ORDER — SODIUM CHLORIDE 0.9 % IV BOLUS (SEPSIS)
500.0000 mL | Freq: Once | INTRAVENOUS | Status: AC
  Administered 2017-03-02: 500 mL via INTRAVENOUS

## 2017-03-02 MED ORDER — SORBITOL 70 % SOLN
960.0000 mL | TOPICAL_OIL | Freq: Once | ORAL | Status: AC
  Administered 2017-03-02: 960 mL via RECTAL
  Filled 2017-03-02: qty 240

## 2017-03-02 MED ORDER — SENNOSIDES-DOCUSATE SODIUM 8.6-50 MG PO TABS: 1.0000 | ORAL_TABLET | Freq: Every day | ORAL | Status: DC | PRN

## 2017-03-02 NOTE — ED Notes (Signed)
Pt ambulated without assistance and oxygen saturations stayed between 94%-97%.

## 2017-03-02 NOTE — Progress Notes (Signed)
Please call Sophia at [phone] at [phone] for report. Andre Lefort

## 2017-03-02 NOTE — H&P (Signed)
History and Physical  Rivka Barbara JJK:[id number] DOB: December 05, 1923 DOA: 03/02/2017  PCP:  Patient, No Pcp Per   Chief Complaint:  Dyspnea   History of Present Illness:  Pt is a 83 yo male with hx of lung cancer w/mets with known massive R.side pleural effusion , likely malignant, with plan for radiation and thoracentesis next week who came today with cc of dyspnea. This has been going on for weeks but worsened over the last couple of days and daughter was not comfortable waiting till next week. Pt denied any fever/chills/cough but has had constant chest pain esp in the right side and also has back pain. In ROS they also reported hip and leg pain w/o motor or sensory defects. No other complaints.   Review of Systems:  CONSTITUTIONAL:     No night sweats.  No fatigue.  No fever. No chills. Eyes:                            No visual changes.  No eye pain.  No eye discharge.   ENT:                              No epistaxis.  No sinus pain.  No sore throat.   No congestion. RESPIRATORY:           No cough.  No wheeze.  No hemoptysis.  +dyspnea CARDIOVASCULAR   :  +chest pains.  No palpitations. GASTROINTESTINAL:  No abdominal pain.  No nausea. No vomiting.  No diarrhea. + constipation.  No hematemesis.  No hematochezia.  No melena. GENITOURINARY:      No urgency.  No frequency.  No dysuria.  No hematuria.  No  obstructive symptoms.  No discharge.  No pain.   MUSCULOSKELETAL:  +musculoskeletal pain.  No joint swelling.  No arthritis. NEUROLOGICAL:        No confusion.  No weakness. No headache. No seizure. PSYCHIATRIC:             No depression. No anxiety. No suicidal ideation. SKIN:                             No rashes.  No lesions.  No wounds. ENDOCRINE:                No weight loss.  No polydipsia.  No polyuria.  No polyphagia. HEMATOLOGIC:           No purpura.  No petechiae.  No bleeding.  ALLERGIC                 : No pruritus.  No angioedema Other:  Past Medical and  Surgical History:   Past Medical History:  Diagnosis Date  . Adenocarcinoma of right lung, stage 4 ([place]) 02/12/2017  . Anxiety   . Arthritis   . Depression   . Hypertension   . Kidney stone    Past Surgical History:  Procedure Laterality Date  . HERNIA REPAIR N/A     Social History:   reports that he quit smoking about 46 years ago. His smoking use included Cigarettes. He has a 10.00 pack-year smoking history. He has never used smokeless tobacco. He reports that he does not drink alcohol or use drugs.    No Known Allergies  Family History  Problem Relation Age of Onset  .  Cancer Daughter        ovarian      Prior to Admission medications   Medication Sig Start Date End Date Taking? Authorizing Provider  albuterol (PROVENTIL HFA;VENTOLIN HFA) 108 (90 Base) MCG/ACT inhaler Inhale into the lungs every 6 (six) hours as needed for wheezing or shortness of breath.   Yes [provider]  buPROPion (ZYBAN) 150 MG 12 hr tablet Take 150 mg by mouth daily.   Yes [provider]  dextromethorphan 7.5 MG/5ML SYRP Take 7.5 mg by mouth every 6 (six) hours as needed (cough).   Yes [provider]  diclofenac sodium (VOLTAREN) 1 % GEL Apply 2 g topically 4 (four) times daily.   Yes [provider]  Glycerin, Laxative, (GLYCERIN ADULT) 2 g SUPP Place 1 suppository rectally once as needed for moderate constipation.   Yes [provider]  hydrALAZINE (APRESOLINE) 25 MG tablet Take 25 mg by mouth 2 (two) times daily.   Yes [provider]  HYDROcodone-acetaminophen (NORCO) 5-325 MG tablet Take 1 tablet by mouth every 4 (four) hours as needed for moderate pain. Patient taking differently: Take 0.5-1 tablets by mouth every 4 (four) hours as needed for moderate pain.  02/18/17  Yes Curt Bears, MD  hydrocortisone 2.5 % cream Apply topically 2 (two) times daily.   Yes [provider]  losartan (COZAAR) 50 MG tablet Take 50 mg by mouth  daily.   Yes [provider]  magnesium citrate SOLN Take 0.5 Bottles by mouth once as needed for severe constipation.   Yes [provider]  mirtazapine (REMERON) 15 MG tablet Take 15 mg by mouth at bedtime.   Yes [provider]  senna-docusate (SENOKOT-S) 8.6-50 MG tablet Take 1 tablet by mouth daily as needed for mild constipation.   Yes [provider]  sodium phosphate Pediatric (FLEET) 3.5-9.5 GM/59ML enema Place 1 enema rectally once as needed for severe constipation.   Yes [provider]    Physical Exam: BP (!) 158/103 (BP Location: Right Arm)   Pulse 98   Temp 97.7 F (36.5 C) (Oral)   Resp (!) 23   SpO2 96%   GENERAL :   Alert and cooperative, and appears to be in no acute distress. HEAD:           normocephalic. EYES:            PERRL, EOMI.  vision is grossly intact. EARS:           hearing grossly intact. NECK:          supple CARDIAC:    Normal S1 and S2. No gallop. No murmurs.  Vascular:     no peripheral edema. LUNGS:       Decreased sounds in right lung ABDOMEN: Positive bowel sounds. Soft, nondistended, nontender. No guarding or rebound.      MSK:           No joint erythema or tenderness.  EXT           : No significant deformity or joint abnormality. Neuro        : Alert, oriented to person, place, and time.                      CN II-XII intact.  SKIN:            No rash. No lesions. PSYCH:       No hallucination. Patient is not suicidal.  Labs on Admission:  Reviewed.   Radiological Exams on Admission: Dg Chest 2 View  Result Date: 03/02/2017 CLINICAL DATA:  Chest pain and shortness of Breath EXAM: CHEST  2 VIEW COMPARISON:  None. FINDINGS: The heart size appears normal. There is aortic atherosclerosis. Near complete opacification of the right hemithorax secondary to malignant right pleural effusion. Aeration to the right lung is similar to 02/26/2017. Left lung is clear. IMPRESSION: 1. Similar  appearance of near complete opacification of the right lung. Electronically Signed   By: Kerby Moors M.D.   On: 03/02/2017 14:23    EKG:  Independently reviewed. Afib  Assessment/Plan   Dyspnea:  Likely due to large R.pleural effusion and adenocarcinoma of lung Consult to IR for R.pleural thoracentesis ( discuss port placement with oncology) Albuterol prn Pt sat > 92 on RA  EKG with afib:  No prior hx in records CHADVASC>2 Will check an Echo Unlikely to be a candidate for full [facility] Will discuss with oncology and family in am Keep on Tele  HTN: cont home meds  Input & Output: NA Lines & Tubes:PIV DVT prophylaxis: [place] hep GI prophylaxis: NA Consultants: IR Code Status: full code  Family Communication: at bedside   Disposition Plan: TBD    Gennaro Africa M.D [facility]

## 2017-03-02 NOTE — ED Triage Notes (Signed)
Pt from home with c/o central chest pain and SOB that began this morning. Pt is being treated for lung cancer with radiation. Pt has appointment on Tuesday to have fluid removed from lungs. Pt has clear unlabored lung sounds. Pt is able to speak in full sentences and is able to maintain his saturation at 96% RA. Pt has strong radial pulses and adequate cap refill

## 2017-03-02 NOTE — ED Provider Notes (Signed)
[facility] DEPT Provider Note   CSN: [id number] Arrival date & time: 03/02/17  1322     History   Chief Complaint Chief Complaint  Patient presents with  . Shortness of Breath    cancer patient     HPI Carlos Bennett is a 83 y.o. male.  Pt presents to the ED today with sob and CP.  Thep t does have a hx of stage IV non small cell lung cancer (adenocarcinoma).  He has a hx of a large right malignant pleural effusion that was last drained on 01/31/17 at [facility] in [place], [place].  The pt has since moved here to [facility] and is here with his daughter.  Pt has followed with Dr. Julien Nordmann (hematology) here and with Dr. Tammi Klippel (rad onc) The pt has an appointment with Dr. Roxan Hockey to drain his recurrent right pleural effusion on 9/18.  Pt's daughter said he has been getting progressively more sob, so she brought him in today.  Daughter also said he's been having a lot of pain from his spinal mets and has been constipated from his pain meds.  No bm in 5 days.         Past Medical History:  Diagnosis Date  . Adenocarcinoma of right lung, stage 4 ([place]) 02/12/2017  . Anxiety   . Arthritis   . Depression   . Hypertension   . Kidney stone     Patient Active Problem List   Diagnosis Date Noted  . Spine metastasis ([place]) 02/18/2017  . Lung cancer ([place]) 02/12/2017  . Adenocarcinoma of right lung, stage 4 ([place]) 02/12/2017    Past Surgical History:  Procedure Laterality Date  . HERNIA REPAIR N/A        Home Medications    Prior to Admission medications   Medication Sig Start Date End Date Taking? Authorizing Provider  albuterol (PROVENTIL HFA;VENTOLIN HFA) 108 (90 Base) MCG/ACT inhaler Inhale into the lungs every 6 (six) hours as needed for wheezing or shortness of breath.   Yes [provider]  buPROPion (ZYBAN) 150 MG 12 hr tablet Take 150 mg by mouth daily.   Yes [provider]  dextromethorphan 7.5 MG/5ML SYRP Take 7.5 mg by mouth  every 6 (six) hours as needed (cough).   Yes [provider]  diclofenac sodium (VOLTAREN) 1 % GEL Apply 2 g topically 4 (four) times daily.   Yes [provider]  Glycerin, Laxative, (GLYCERIN ADULT) 2 g SUPP Place 1 suppository rectally once as needed for moderate constipation.   Yes [provider]  hydrALAZINE (APRESOLINE) 25 MG tablet Take 25 mg by mouth 2 (two) times daily.   Yes [provider]  HYDROcodone-acetaminophen (NORCO) 5-325 MG tablet Take 1 tablet by mouth every 4 (four) hours as needed for moderate pain. Patient taking differently: Take 0.5-1 tablets by mouth every 4 (four) hours as needed for moderate pain.  02/18/17  Yes Curt Bears, MD  hydrocortisone 2.5 % cream Apply topically 2 (two) times daily.   Yes [provider]  losartan (COZAAR) 50 MG tablet Take 50 mg by mouth daily.   Yes [provider]  magnesium citrate SOLN Take 0.5 Bottles by mouth once as needed for severe constipation.   Yes [provider]  mirtazapine (REMERON) 15 MG tablet Take 15 mg by mouth at bedtime.   Yes [provider]  senna-docusate (SENOKOT-S) 8.6-50 MG tablet Take 1 tablet by mouth daily as needed for mild constipation.   Yes [provider]  sodium phosphate Pediatric (FLEET) 3.5-9.5 GM/59ML enema Place 1 enema rectally once as needed for severe constipation.   Yes [provider]    Family History Family History  Problem Relation Age of Onset  . Cancer Daughter        ovarian    Social History Social History  Substance Use Topics  . Smoking status: Former Smoker    Packs/day: 1.00    Years: 10.00    Types: Cigarettes    Quit date: 06/17/1970  . Smokeless tobacco: Never Used  . Alcohol use No     Allergies   Patient has no known allergies.   Review of Systems Review of Systems  Respiratory: Positive for shortness of breath.   Gastrointestinal: Positive for constipation.    Musculoskeletal: Positive for back pain.  All other systems reviewed and are negative.    Physical Exam Updated Vital Signs BP (!) 158/103 (BP Location: Right Arm)   Pulse 98   Temp 97.7 F (36.5 C) (Oral)   Resp (!) 23   SpO2 96%   Physical Exam  Constitutional: He is oriented to person, place, and time. He appears well-developed and well-nourished.  HENT:  Head: Normocephalic and atraumatic.  Right Ear: External ear normal.  Left Ear: External ear normal.  Nose: Nose normal.  Mouth/Throat: Oropharynx is clear and moist.  Eyes: Pupils are equal, round, and reactive to light. Conjunctivae and EOM are normal.  Neck: Normal range of motion. Neck supple.  Cardiovascular: Normal rate, regular rhythm, normal heart sounds and intact distal pulses.   Pulmonary/Chest: Effort normal and breath sounds normal.  Abdominal: Soft. Bowel sounds are decreased.  Musculoskeletal: Normal range of motion.  Neurological: He is alert and oriented to person, place, and time.  Skin: Skin is warm.  Psychiatric: He has a normal mood and affect. His behavior is normal. Judgment and thought content normal.  Nursing note and vitals reviewed.    ED Treatments / Results  Labs (all labs ordered are listed, but only abnormal results are displayed) Labs Reviewed  CBC  BASIC METABOLIC PANEL  I-STAT TROPONIN, ED  POCT I-STAT TROPONIN I  I-STAT CHEM 8, ED    EKG  EKG Interpretation None       Radiology Dg Chest 2 View  Result Date: 03/02/2017 CLINICAL DATA:  Chest pain and shortness of Breath EXAM: CHEST  2 VIEW COMPARISON:  None. FINDINGS: The heart size appears normal. There is aortic atherosclerosis. Near complete opacification of the right hemithorax secondary to malignant right pleural effusion. Aeration to the right lung is similar to 02/26/2017. Left lung is clear. IMPRESSION: 1. Similar appearance of near complete opacification of the right lung. Electronically Signed   By: Kerby Moors M.D.   On: 03/02/2017 14:23    Procedures Procedures (including critical care time)  Medications Ordered in ED Medications  morphine 4 MG/ML injection 4 mg (not administered)  ondansetron (ZOFRAN) injection 4 mg (not administered)  sorbitol, milk of mag, mineral oil, glycerin (SMOG) enema (not administered)  sodium chloride 0.9 % bolus 500 mL (500 mLs Intravenous New Bag/Given 03/02/17 1447)     Initial Impression / Assessment and Plan / ED Course  I have reviewed the triage vital signs and the nursing notes.  Pertinent labs & imaging results that were available during my care of the patient were reviewed by me and considered in my medical decision making (see chart for details).   Pt d/w Dr. Dreama Saa ([facility]) for  admission for pain control and for the sob.   Final Clinical Impressions(s) / ED Diagnoses   Final diagnoses:  Malignant pleural effusion  Drug-induced constipation  Pain from bone metastases [facility])    New Prescriptions New Prescriptions   No medications on file     Isla Pence, MD 03/02/17 1649

## 2017-03-02 NOTE — ED Notes (Signed)
2 failed attempts to collect labs 

## 2017-03-02 NOTE — Progress Notes (Signed)
Patient admitted to Room 1421. Ambulated from stretcher to bed with assistance. Tele placed. Oriented to room, room phone and call bell. Advised to not get OOB without assistance. Daughter at bedside.  Lind Guest, RN

## 2017-03-03 ENCOUNTER — Inpatient Hospital Stay (HOSPITAL_COMMUNITY): Payer: Medicare Other

## 2017-03-03 ENCOUNTER — Ambulatory Visit
Admission: RE | Admit: 2017-03-03 | Discharge: 2017-03-03 | Disposition: A | Discharge status: Home / Self Care | Payer: Medicare Other | Source: Ambulatory Visit | Attending: Radiation Oncology | Admitting: Radiation Oncology

## 2017-03-03 DIAGNOSIS — I10 Essential (primary) hypertension: Secondary | ICD-10-CM

## 2017-03-03 DIAGNOSIS — J910 Malignant pleural effusion: Secondary | ICD-10-CM

## 2017-03-03 LAB — CBC
HCT: 37.7 % — ABNORMAL LOW (ref 39.0–52.0)
HEMOGLOBIN: 12.6 g/dL — AB (ref 13.0–17.0)
MCH: 30.4 pg (ref 26.0–34.0)
MCHC: 33.4 g/dL (ref 30.0–36.0)
MCV: 90.8 fL (ref 78.0–100.0)
PLATELETS: 194 10*3/uL (ref 150–400)
RBC: 4.15 MIL/uL — AB (ref 4.22–5.81)
RDW: 13.8 % (ref 11.5–15.5)
WBC: 5.6 10*3/uL (ref 4.0–10.5)

## 2017-03-03 LAB — COMPREHENSIVE METABOLIC PANEL
ALK PHOS: 86 U/L (ref 38–126)
ALT: 14 U/L — AB (ref 17–63)
AST: 19 U/L (ref 15–41)
Albumin: 3.4 g/dL — ABNORMAL LOW (ref 3.5–5.0)
Anion gap: 9 (ref 5–15)
BILIRUBIN TOTAL: 0.9 mg/dL (ref 0.3–1.2)
BUN: 24 mg/dL — ABNORMAL HIGH (ref 6–20)
CALCIUM: 9 mg/dL (ref 8.9–10.3)
CO2: 28 mmol/L (ref 22–32)
CREATININE: 1.2 mg/dL (ref 0.61–1.24)
Chloride: 102 mmol/L (ref 101–111)
GFR, EST AFRICAN AMERICAN: 59 mL/min — AB (ref >60)
GFR, EST NON AFRICAN AMERICAN: 51 mL/min — AB (ref >60)
Glucose, Bld: 106 mg/dL — ABNORMAL HIGH (ref 65–99)
Potassium: 4.2 mmol/L (ref 3.5–5.1)
SODIUM: 139 mmol/L (ref 135–145)
TOTAL PROTEIN: 6.6 g/dL (ref 6.5–8.1)

## 2017-03-03 LAB — ECHOCARDIOGRAM COMPLETE
HEIGHTINCHES: 66.5 in
WEIGHTICAEL: 2134.05 [oz_av]

## 2017-03-03 MED ORDER — HYDROCODONE-ACETAMINOPHEN 5-325 MG PO TABS
1.0000 | ORAL_TABLET | ORAL | Status: DC | PRN
  Administered 2017-03-05: 1 via ORAL
  Filled 2017-03-03: qty 1

## 2017-03-03 MED ORDER — INFLUENZA VAC SPLIT HIGH-DOSE 0.5 ML IM SUSY
0.5000 mL | PREFILLED_SYRINGE | INTRAMUSCULAR | Status: AC
  Administered 2017-03-04: 0.5 mL via INTRAMUSCULAR
  Filled 2017-03-03: qty 0.5

## 2017-03-03 MED ORDER — PNEUMOCOCCAL VAC POLYVALENT 25 MCG/0.5ML IJ INJ
0.5000 mL | INJECTION | INTRAMUSCULAR | Status: AC
  Administered 2017-03-04: 0.5 mL via INTRAMUSCULAR
  Filled 2017-03-03: qty 0.5

## 2017-03-03 NOTE — Progress Notes (Signed)
  Echocardiogram 2D Echocardiogram has been performed.  Technically difficult exam due to patient small rib spacing and restricted mobility. Due to elevated heart rate, left ventricular function is difficult to assess.   Maze Corniel L Androw 03/03/2017, 12:35 PM

## 2017-03-03 NOTE — Progress Notes (Signed)
PT Cancellation Note  Patient Details Name: Carlos Bennett MRN: [id number] DOB: 1924/04/28   Cancelled Treatment:    Reason Eval/Treat Not Completed: Other (comment) (will await plan per IR and check back as schedule permits)   Lelania Bia,KATHrine E 03/03/2017, 10:47 AM Carmelia Bake, PT, DPT 03/03/2017 Pager: [phone]

## 2017-03-03 NOTE — Progress Notes (Signed)
The patient's daughter is at bedside. She is keen to talk with the MD about care for the patient etc.

## 2017-03-03 NOTE — Progress Notes (Signed)
Patient ID: Carlos Bennett, male   DOB: 1924/01/16, 83 y.o.   MRN: [id number]                                                                PROGRESS NOTE                                                                                                                                                                                                             Patient Demographics:    Carlos Bennett, is a 83 y.o. male, DOB - 12-16-1923, TIW:[id number]  Admit date - 03/02/2017   Admitting Physician Gennaro Africa, MD  Outpatient Primary MD for the patient is Patient, No Pcp Per  LOS - 1  Outpatient Specialists:   Curt Bears Dr. Tammi Klippel  Chief Complaint  Patient presents with  . Shortness of Breath    cancer patient        Brief Narrative 83 yo male with hx of lung cancer w/mets with known massive R.side pleural effusion , likely malignant, with plan for radiation and thoracentesis next week who came today with cc of dyspnea. This has been going on for weeks but worsened over the last couple of days and daughter was not comfortable waiting till next week. Pt denied any fever/chills/cough but has had constant chest pain esp in the right side and also has back pain. In ROS they also reported hip and leg pain w/o motor or sensory defects. No other complaints.    Subjective:    Carlos Bennett today has dyspnea,  Required some oxygen last nite.  Afebrile.  Slight dry cough.   No headache, No chest pain, No abdominal pain - No Nausea, No new weakness tingling or numbness,   Assessment  & Plan :    Active Problems:   Dyspnea   Dyspnea:  Likely due to large R.pleural effusion and adenocarcinoma of lung Consult to IR for R.pleural thoracentesis ( discuss port placement with oncology) IR paged this am to evaluate the patient for thoracentesis/ pleurex catheter this am Albuterol prn Pt sat > 92 on RA  Metastatic lung cancer Radiology oncology input requested  EKG with afib:  No prior  hx in records CHADVASC>2 Will check an Echo Unlikely to be a candidate for full [facility] Will discuss with oncology and  family in am Keep on Tele  HTN: cont home meds     Code Status :  DNR  Family Communication  : w daughter, and patient  Disposition Plan  : home  Barriers For Discharge :   Consults  :  Interventional radiology  Procedures  : IR thoracentesis  DVT Prophylaxis  :   Heparin - SCDs   Lab Results  Component Value Date   PLT 291 03/02/2017      Anti-infectives    None        Objective:   Vitals:   03/02/17 1845 03/02/17 2031 03/02/17 2059 03/03/17 0604  BP: (!) 179/103  (!) 123/93 (!) 147/81  Pulse: (!) 110  (!) 104 100  Resp: 18  20 20   Temp: 98.7 F (37.1 C)  98.4 F (36.9 C) 98.9 F (37.2 C)  TempSrc: Oral  Oral Oral  SpO2: 92%  92% 98%  Weight:  60.5 kg (133 lb 6.1 oz)    Height:  5' 6.5" (1.689 m)      Wt Readings from Last 3 Encounters:  03/02/17 60.5 kg (133 lb 6.1 oz)  02/19/17 60.1 kg (132 lb 6.4 oz)  02/26/17 59.9 kg (132 lb)    No intake or output data in the 24 hours ending 03/03/17 0808   Physical Exam  Awake Alert, Oriented X 3, No new F.N deficits, Normal affect [name].AT,PERRAL Supple Neck,No JVD, No cervical lymphadenopathy appriciated.  Symmetrical Chest wall movement, Good air movement bilaterally, decrease in BS at right lung base about 1/2 up RRR,No Gallops,Rubs or new Murmurs, No Parasternal Heave +ve B.Sounds, Abd Soft, No tenderness, No organomegaly appriciated, No rebound - guarding or rigidity. No Cyanosis, Clubbing or edema, No new Rash or bruise      Data Review:    CBC  Recent Labs Lab 03/02/17 1440  WBC 6.0  HGB 13.6  HCT 40.3  PLT 291  MCV 91.4  MCH 30.8  MCHC 33.7  RDW 13.8    Chemistries   Recent Labs Lab 03/02/17 1615  NA 135  K 4.0  CL 100*  CO2 27  GLUCOSE 99  BUN 18  CREATININE 0.99  CALCIUM 8.8*    ------------------------------------------------------------------------------------------------------------------ No results for input(s): CHOL, HDL, LDLCALC, TRIG, CHOLHDL, LDLDIRECT in the last 72 hours.  No results found for: HGBA1C ------------------------------------------------------------------------------------------------------------------ No results for input(s): TSH, T4TOTAL, T3FREE, THYROIDAB in the last 72 hours.  Invalid input(s): FREET3 ------------------------------------------------------------------------------------------------------------------ No results for input(s): VITAMINB12, FOLATE, FERRITIN, TIBC, IRON, RETICCTPCT in the last 72 hours.  Coagulation profile No results for input(s): INR, PROTIME in the last 168 hours.  No results for input(s): DDIMER in the last 72 hours.  Cardiac Enzymes No results for input(s): CKMB, TROPONINI, MYOGLOBIN in the last 168 hours.  Invalid input(s): CK ------------------------------------------------------------------------------------------------------------------ No results found for: BNP  Inpatient Medications  Scheduled Meds: . buPROPion  150 mg Oral Daily  . heparin  5,000 Units Subcutaneous Q8H  . hydrALAZINE  25 mg Oral BID  . [START ON 03/04/2017] Influenza vac split quadrivalent PF  0.5 mL Intramuscular Tomorrow-1000  . losartan  50 mg Oral Daily  . mirtazapine  15 mg Oral QHS  . [START ON 03/04/2017] pneumococcal 23 valent vaccine  0.5 mL Intramuscular Tomorrow-1000   Continuous Infusions: PRN Meds:.albuterol, dextromethorphan, morphine injection, senna-docusate  Micro Results No results found for this or any previous visit (from the past 240 hour(s)).  Radiology Reports Dg Chest 2 View  Result Date: 03/02/2017 CLINICAL DATA:  Chest pain and shortness of Breath EXAM: CHEST  2 VIEW COMPARISON:  None. FINDINGS: The heart size appears normal. There is aortic atherosclerosis. Near complete opacification of  the right hemithorax secondary to malignant right pleural effusion. Aeration to the right lung is similar to 02/26/2017. Left lung is clear. IMPRESSION: 1. Similar appearance of near complete opacification of the right lung. Electronically Signed   By: Kerby Moors M.D.   On: 03/02/2017 14:23   Mr Jeri Cos EN Contrast  Result Date: 02/26/2017 CLINICAL DATA:  New diagnosis stage or non-small-cell lung carcinoma with thoracic metastatic disease. EXAM: MRI HEAD WITHOUT AND WITH CONTRAST TECHNIQUE: Multiplanar, multiecho pulse sequences of the brain and surrounding structures were obtained without and with intravenous contrast. CONTRAST:  55mL MULTIHANCE GADOBENATE DIMEGLUMINE 529 MG/ML IV SOLN COMPARISON:  None. FINDINGS: Brain: Diffusion imaging does not show any acute or subacute infarction. There chronic small-vessel ischemic changes affecting the pons, cerebellum an within the cerebral hemispheric white matter. No cortical or large vessel territory infarction. No mass lesion, hemorrhage, hydrocephalus or extra-axial collection. No abnormal contrast enhancement. Vascular: Major vessels at the base of the brain show flow. Skull and upper cervical spine: Heterogeneous marrow pattern in the upper cervical spine. Metastatic disease to that region not excluded. No calvarial metastasis identified. Sinuses/Orbits: Clear/normal Other: None IMPRESSION: No evidence of cranial or intracranial metastatic disease. Chronic small-vessel ischemic changes. Heterogeneous marrow pattern in the upper cervical spine. Nonspecific, but early metastatic disease not excluded. Electronically Signed   By: Nelson Chimes M.D.   On: 02/26/2017 13:12   Mr Thoracic Spine W Wo Contrast  Result Date: 02/28/2017 CLINICAL DATA:  Non-small cell lung cancer. Pretreatment evaluation. EXAM: MRI THORACIC WITHOUT AND WITH CONTRAST TECHNIQUE: Multiplanar and multiecho pulse sequences of the thoracic spine were obtained without and with intravenous  contrast. CONTRAST:  38mL MULTIHANCE GADOBENATE DIMEGLUMINE 529 MG/ML IV SOLN COMPARISON:  PET-CT 02/26/2017 FINDINGS: MRI THORACIC SPINE FINDINGS Alignment:  Unremarkable Vertebrae: Infiltrative marrow signal abnormality throughout the T10 and T11 bodies, with rounded lytic areas in the posterior body at both levels, containing partially cystic material with some fluid fluid levels. The posterior cortex is eroded at both levels with ventral epidural tumor infiltration that is right eccentric. Epidural tumor is greater at T10 where the thecal sac is posteriorly displaced to contact the non compressed cord and the right ventral nerve root. The foramina are patent. Cord: No evidence of intrathecal metastasis. No cord signal abnormality. Posterior right fourth rib metastasis, transcortical, best seen on axial slices. Paraspinal and other soft tissues: Large right pleural effusion with near complete right lung collapse. The mediastinum is shifted to the left. These features were also seen on recent PET-CT. Disc levels: Generalized thoracic disc narrowing and endplate spurring. No degenerative impingement. IMPRESSION: 1. T10 and T11 body metastases with dorsal cortical erosion and right eccentric ventral epidural tumor spread. At T10 tumor displaces the thecal sac to the level of the cord, without compression. 2. Right posterior fourth rib metastasis. 3. Known large right pleural effusion with lung collapse and mediastinal shift. Electronically Signed   By: Monte Fantasia M.D.   On: 02/28/2017 08:54   Nm Pet Image Initial (pi) Skull Base To Thigh  Result Date: 02/26/2017 CLINICAL DATA:  Subsequent treatment strategy for metastatic lung cancer. EXAM: NUCLEAR MEDICINE PET SKULL BASE TO THIGH TECHNIQUE: 6.8 mCi F-18 FDG was injected intravenously. Full-ring PET imaging was performed from the skull base to thigh after the radiotracer. CT data was obtained  and used for attenuation correction and anatomic localization.  FASTING BLOOD GLUCOSE:  Value: 110 mg/dl COMPARISON:  [facility] dated 01/31/2017 and 01/29/2017. FINDINGS: NECK No hypermetabolic lymph nodes in the neck. Anterior left thalamic lacunar infarct noted. Carotid atherosclerotic calcifications. Incidental failure of fusion of the posterior arch of C1. CHEST Large right pleural effusion with complete collapse of the right middle lobe and right lower lobe, and nearly complete collapse of the right upper lobe. There is mildly accentuated metabolic activity along the right pleural effusion compatible with malignant effusion, representative metabolic activity along the pleural margin with SUV approximately 2.4, pleural or adjacent parenchymal tumor along the right upper lobe medially has maximum standard uptake value of 7.3. There foci of tumor occludes the presence in the atelectatic portions of the right upper lobe, right middle lobe, and right lower lobe. Dominant involvement medially in the right middle lobe has a maximum SUV of 13.2 and measures about 3.2 cm in long axis. Given the tumor adjacent to the right hilum, a component of right hilar involvement is not excluded. Small calcified nodules in the left lung are compatible with old granulomatous disease and are not hypermetabolic. 3 mm left upper lobe nodule on image 14/8 is not calcified and has no measurable hypermetabolic activity. Coronary, aortic arch, and branch vessel atherosclerotic vascular disease. Calcified hilar and mediastinal lymph nodes are likely from old granulomatous disease. Shift of cardiac and mediastinal structures to the left. ABDOMEN/PELVIS 8 mm right adrenal nodule on image 114/4 has faintly accentuated metabolic activity with maximum SUV 3.8, early metastatic disease not excluded. Aortoiliac atherosclerotic vascular disease. Infrarenal abdominal aortic aneurysm 3.1 cm in diameter on image 132/4. Sigmoid colon diverticulosis. Prominent stool throughout the  colon favors constipation. Prostatomegaly. SKELETON Scattered hypermetabolic lytic lesions involving the thoracic spine, lumbar spine, ribs, right iliac bone, left iliac bone, right sacrum, and right superior pubic ramus. The dominant lesion at the T10 vertebral level destroys the posterior aspect of the vertebral body and appears to be invading the epidural space, cord compression not excluded. Similarly at T11 the posterior vertebral cortex is destroyed and there could be early epidural invasion. At T10 the maximum SUV is 15.2. IMPRESSION: 1. Malignant right pleural effusion with scattered hypermetabolic lesions in all 3 lobes of the right lung, as well as scattered osseous metastatic lesions. Osseous metastatic lesions at T10 and T11 appear to be invading through the posterior vertebral body and into the epidural space can't could cause cord compression- thoracic spine MRI with and without contrast should be strongly considered for definitive characterization. Most of the right lung is collapsed, except for a small portion of the right upper lobe. 2. Small faintly hypermetabolic nodule in the right adrenal gland may represent a metastatic nodule. 3. Large right pleural effusion causes displacement of mediastinal and cardiac structures to the left. 4. 3.1 cm infrarenal abdominal aortic aneurysm. Recommend followup by [place] in 3 years. This recommendation follows ACR consensus guidelines: White Paper of the ACR Incidental Findings Committee II on Vascular Findings. J Am Coll Radiol 2013; 29:528-413 5. Other imaging findings of potential clinical significance: Aortic Atherosclerosis (ICD10-I70.0). Coronary atherosclerosis. Sigmoid colon diverticulosis. Prominent stool throughout the colon favors constipation. Prostatomegaly. Electronically Signed   By: Van Clines M.D.   On: 02/26/2017 11:29    Time Spent in minutes  30   Jani Gravel M.D on 03/03/2017 at 8:08 AM  Between 7am to 7pm - Pager - [phone]    After 7pm  go to [email or web address] - password [email or web address]  [facility] -  Office  [phone]

## 2017-03-03 NOTE — Consult Note (Signed)
Chief Complaint: Patient was seen in consultation today for pleural effusion  Referring Physician(s):  Dr. Jani Gravel  History of Present Illness: Carlos Bennett is a 83 y.o. male with past medical history of anxiety, arthritis, HTN, and stage 4 lung cancer who presents to [facility] ED with shortness of breath.   CXR 03/02/17 shows: The heart size appears normal. There is aortic atherosclerosis. Near complete opacification of the right hemithorax secondary to malignant right pleural effusion. Aeration to the right lung is similar to 02/26/2017. Left lung is clear.  IR consulted for PleurX catheter placement at the request of Dr. Jani Gravel.  Patient has eaten breakfast this AM and has been getting regular heparin injections q8 hrs.   Past Medical History:  Diagnosis Date  . Adenocarcinoma of right lung, stage 4 ([place]) 02/12/2017  . Anxiety   . Arthritis   . Depression   . Hypertension   . Kidney stone     Past Surgical History:  Procedure Laterality Date  . HERNIA REPAIR N/A     Allergies: Patient has no known allergies.  Medications: Prior to Admission medications   Medication Sig Start Date End Date Taking? Authorizing Provider  albuterol (PROVENTIL HFA;VENTOLIN HFA) 108 (90 Base) MCG/ACT inhaler Inhale into the lungs every 6 (six) hours as needed for wheezing or shortness of breath.   Yes [provider]  buPROPion (ZYBAN) 150 MG 12 hr tablet Take 150 mg by mouth daily.   Yes [provider]  dextromethorphan 7.5 MG/5ML SYRP Take 7.5 mg by mouth every 6 (six) hours as needed (cough).   Yes [provider]  diclofenac sodium (VOLTAREN) 1 % GEL Apply 2 g topically 4 (four) times daily.   Yes [provider]  Glycerin, Laxative, (GLYCERIN ADULT) 2 g SUPP Place 1 suppository rectally once as needed for moderate constipation.   Yes [provider]  hydrALAZINE (APRESOLINE) 25 MG tablet Take 25 mg by mouth 2 (two) times daily.   Yes  [provider]  HYDROcodone-acetaminophen (NORCO) 5-325 MG tablet Take 1 tablet by mouth every 4 (four) hours as needed for moderate pain. Patient taking differently: Take 0.5-1 tablets by mouth every 4 (four) hours as needed for moderate pain.  02/18/17  Yes Curt Bears, MD  hydrocortisone 2.5 % cream Apply topically 2 (two) times daily.   Yes [provider]  losartan (COZAAR) 50 MG tablet Take 50 mg by mouth daily.   Yes [provider]  magnesium citrate SOLN Take 0.5 Bottles by mouth once as needed for severe constipation.   Yes [provider]  mirtazapine (REMERON) 15 MG tablet Take 15 mg by mouth at bedtime.   Yes [provider]  senna-docusate (SENOKOT-S) 8.6-50 MG tablet Take 1 tablet by mouth daily as needed for mild constipation.   Yes [provider]  sodium phosphate Pediatric (FLEET) 3.5-9.5 GM/59ML enema Place 1 enema rectally once as needed for severe constipation.   Yes [provider]     Family History  Problem Relation Age of Onset  . Cancer Daughter        ovarian    Social History   Social History  . Marital status: Legally Separated    Spouse name: N/A  . Number of children: N/A  . Years of education: N/A   Social History Main Topics  . Smoking status: Former Smoker    Packs/day: 1.00    Years: 10.00    Types: Cigarettes  Quit date: 06/17/1970  . Smokeless tobacco: Never Used  . Alcohol use No  . Drug use: No  . Sexual activity: No   Other Topics Concern  . None   Social History Narrative  . None    Review of Systems  Constitutional: Negative for fatigue and fever.  Respiratory: Positive for cough and shortness of breath.   Psychiatric/Behavioral: Negative for behavioral problems and confusion.    Vital Signs: BP (!) 147/81 (BP Location: Left Arm)   Pulse 100   Temp 98.9 F (37.2 C) (Oral)   Resp 20   Ht 5' 6.5" (1.689 m)   Wt 133 lb 6.1 oz (60.5 kg)   SpO2 98%   BMI  21.21 kg/m   Physical Exam  Constitutional: He is oriented to person, place, and time. He appears well-developed.  Cardiovascular: Normal rate, regular rhythm and normal heart sounds.   Pulmonary/Chest: Effort normal and breath sounds normal. No respiratory distress.  Neurological: He is alert and oriented to person, place, and time.  Skin: Skin is warm and dry.  Psychiatric: He has a normal mood and affect. His behavior is normal. Judgment and thought content normal.  Nursing note and vitals reviewed.   Mallampati Score:  MD Evaluation Airway: WNL Heart: WNL Abdomen: WNL Chest/ Lungs: WNL ASA  Classification: 3 Mallampati/Airway Score: Two  Imaging: Dg Chest 2 View  Result Date: 03/02/2017 CLINICAL DATA:  Chest pain and shortness of Breath EXAM: CHEST  2 VIEW COMPARISON:  None. FINDINGS: The heart size appears normal. There is aortic atherosclerosis. Near complete opacification of the right hemithorax secondary to malignant right pleural effusion. Aeration to the right lung is similar to 02/26/2017. Left lung is clear. IMPRESSION: 1. Similar appearance of near complete opacification of the right lung. Electronically Signed   By: Kerby Moors M.D.   On: 03/02/2017 14:23   Mr Jeri Cos DX Contrast  Result Date: 02/26/2017 CLINICAL DATA:  New diagnosis stage or non-small-cell lung carcinoma with thoracic metastatic disease. EXAM: MRI HEAD WITHOUT AND WITH CONTRAST TECHNIQUE: Multiplanar, multiecho pulse sequences of the brain and surrounding structures were obtained without and with intravenous contrast. CONTRAST:  18mL MULTIHANCE GADOBENATE DIMEGLUMINE 529 MG/ML IV SOLN COMPARISON:  None. FINDINGS: Brain: Diffusion imaging does not show any acute or subacute infarction. There chronic small-vessel ischemic changes affecting the pons, cerebellum an within the cerebral hemispheric white matter. No cortical or large vessel territory infarction. No mass lesion, hemorrhage, hydrocephalus or  extra-axial collection. No abnormal contrast enhancement. Vascular: Major vessels at the base of the brain show flow. Skull and upper cervical spine: Heterogeneous marrow pattern in the upper cervical spine. Metastatic disease to that region not excluded. No calvarial metastasis identified. Sinuses/Orbits: Clear/normal Other: None IMPRESSION: No evidence of cranial or intracranial metastatic disease. Chronic small-vessel ischemic changes. Heterogeneous marrow pattern in the upper cervical spine. Nonspecific, but early metastatic disease not excluded. Electronically Signed   By: Nelson Chimes M.D.   On: 02/26/2017 13:12   Mr Thoracic Spine W Wo Contrast  Result Date: 02/28/2017 CLINICAL DATA:  Non-small cell lung cancer. Pretreatment evaluation. EXAM: MRI THORACIC WITHOUT AND WITH CONTRAST TECHNIQUE: Multiplanar and multiecho pulse sequences of the thoracic spine were obtained without and with intravenous contrast. CONTRAST:  8mL MULTIHANCE GADOBENATE DIMEGLUMINE 529 MG/ML IV SOLN COMPARISON:  PET-CT 02/26/2017 FINDINGS: MRI THORACIC SPINE FINDINGS Alignment:  Unremarkable Vertebrae: Infiltrative marrow signal abnormality throughout the T10 and T11 bodies, with rounded lytic areas in the posterior body at both levels,  containing partially cystic material with some fluid fluid levels. The posterior cortex is eroded at both levels with ventral epidural tumor infiltration that is right eccentric. Epidural tumor is greater at T10 where the thecal sac is posteriorly displaced to contact the non compressed cord and the right ventral nerve root. The foramina are patent. Cord: No evidence of intrathecal metastasis. No cord signal abnormality. Posterior right fourth rib metastasis, transcortical, best seen on axial slices. Paraspinal and other soft tissues: Large right pleural effusion with near complete right lung collapse. The mediastinum is shifted to the left. These features were also seen on recent PET-CT. Disc  levels: Generalized thoracic disc narrowing and endplate spurring. No degenerative impingement. IMPRESSION: 1. T10 and T11 body metastases with dorsal cortical erosion and right eccentric ventral epidural tumor spread. At T10 tumor displaces the thecal sac to the level of the cord, without compression. 2. Right posterior fourth rib metastasis. 3. Known large right pleural effusion with lung collapse and mediastinal shift. Electronically Signed   By: Monte Fantasia M.D.   On: 02/28/2017 08:54   Nm Pet Image Initial (pi) Skull Base To Thigh  Result Date: 02/26/2017 CLINICAL DATA:  Subsequent treatment strategy for metastatic lung cancer. EXAM: NUCLEAR MEDICINE PET SKULL BASE TO THIGH TECHNIQUE: 6.8 mCi F-18 FDG was injected intravenously. Full-ring PET imaging was performed from the skull base to thigh after the radiotracer. CT data was obtained and used for attenuation correction and anatomic localization. FASTING BLOOD GLUCOSE:  Value: 110 mg/dl COMPARISON:  CT examinations from [facility] dated 01/31/2017 and 01/29/2017. FINDINGS: NECK No hypermetabolic lymph nodes in the neck. Anterior left thalamic lacunar infarct noted. Carotid atherosclerotic calcifications. Incidental failure of fusion of the posterior arch of C1. CHEST Large right pleural effusion with complete collapse of the right middle lobe and right lower lobe, and nearly complete collapse of the right upper lobe. There is mildly accentuated metabolic activity along the right pleural effusion compatible with malignant effusion, representative metabolic activity along the pleural margin with SUV approximately 2.4, pleural or adjacent parenchymal tumor along the right upper lobe medially has maximum standard uptake value of 7.3. There foci of tumor occludes the presence in the atelectatic portions of the right upper lobe, right middle lobe, and right lower lobe. Dominant involvement medially in the right middle lobe has a maximum SUV of  13.2 and measures about 3.2 cm in long axis. Given the tumor adjacent to the right hilum, a component of right hilar involvement is not excluded. Small calcified nodules in the left lung are compatible with old granulomatous disease and are not hypermetabolic. 3 mm left upper lobe nodule on image 14/8 is not calcified and has no measurable hypermetabolic activity. Coronary, aortic arch, and branch vessel atherosclerotic vascular disease. Calcified hilar and mediastinal lymph nodes are likely from old granulomatous disease. Shift of cardiac and mediastinal structures to the left. ABDOMEN/PELVIS 8 mm right adrenal nodule on image 114/4 has faintly accentuated metabolic activity with maximum SUV 3.8, early metastatic disease not excluded. Aortoiliac atherosclerotic vascular disease. Infrarenal abdominal aortic aneurysm 3.1 cm in diameter on image 132/4. Sigmoid colon diverticulosis. Prominent stool throughout the colon favors constipation. Prostatomegaly. SKELETON Scattered hypermetabolic lytic lesions involving the thoracic spine, lumbar spine, ribs, right iliac bone, left iliac bone, right sacrum, and right superior pubic ramus. The dominant lesion at the T10 vertebral level destroys the posterior aspect of the vertebral body and appears to be invading the epidural space, cord compression not excluded. Similarly at  T11 the posterior vertebral cortex is destroyed and there could be early epidural invasion. At T10 the maximum SUV is 15.2. IMPRESSION: 1. Malignant right pleural effusion with scattered hypermetabolic lesions in all 3 lobes of the right lung, as well as scattered osseous metastatic lesions. Osseous metastatic lesions at T10 and T11 appear to be invading through the posterior vertebral body and into the epidural space can't could cause cord compression- thoracic spine MRI with and without contrast should be strongly considered for definitive characterization. Most of the right lung is collapsed, except for  a small portion of the right upper lobe. 2. Small faintly hypermetabolic nodule in the right adrenal gland may represent a metastatic nodule. 3. Large right pleural effusion causes displacement of mediastinal and cardiac structures to the left. 4. 3.1 cm infrarenal abdominal aortic aneurysm. Recommend followup by [place] in 3 years. This recommendation follows ACR consensus guidelines: White Paper of the ACR Incidental Findings Committee II on Vascular Findings. J Am Coll Radiol 2013; 73:220-254 5. Other imaging findings of potential clinical significance: Aortic Atherosclerosis (ICD10-I70.0). Coronary atherosclerosis. Sigmoid colon diverticulosis. Prominent stool throughout the colon favors constipation. Prostatomegaly. Electronically Signed   By: Van Clines M.D.   On: 02/26/2017 11:29    Labs:  CBC:  Recent Labs  02/12/17 1341 03/02/17 1440 03/03/17 0829  WBC 6.4 6.0 5.6  HGB 12.8* 13.6 12.6*  HCT 39.5 40.3 37.7*  PLT 221 291 194    COAGS: No results for input(s): INR, APTT in the last 8760 hours.  BMP:  Recent Labs  02/12/17 1341 03/02/17 1615 03/03/17 0829  NA 138 135 139  K 4.8 4.0 4.2  CL  --  100* 102  CO2 28 27 28   GLUCOSE 93 99 106*  BUN 24.2 18 24*  CALCIUM 9.4 8.8* 9.0  CREATININE 1.1 0.99 1.20  GFRNONAA  --  >60 51*  GFRAA  --  >60 59*    LIVER FUNCTION TESTS:  Recent Labs  02/12/17 1341 03/03/17 0829  BILITOT 0.48 0.9  AST 18 19  ALT 11 14*  ALKPHOS 108 86  PROT 7.3 6.6  ALBUMIN 3.3* 3.4*    TUMOR MARKERS: No results for input(s): AFPTM, CEA, CA199, CHROMGRNA in the last 8760 hours.  Assessment and Plan: Patient with past medical history of lung cancer presents with complaint of recurrent pleural effusion.  Patient has undergone recent thoracentesis at OSH 01/31/17 and has reaccumulation of fluid with symptomatic shortness of breath.  IR consulted for thoracentesis and PleurX placement at the request of Dr. Maudie Mercury. Case reviewed by Dr. Kathlene Cote  who approves patient for procedure.  Patient has eaten breakfast this AM.  Discussed with family and patient.  They would strongly prefer Pleurx placement to additional thoracentesis.  Patient currently stable on [place] and appear comfortable.  Will plan to proceed with PleurX placement tomorrow as schedule allows.  Patient is to be NPO after midnight and heparin has been held for tomorrow morning at 6AM dose.  Risks and benefits discussed with the patient including bleeding, infection, and damage to adjacent structures. All of the patient's questions were answered, patient is agreeable to proceed. Consent signed and in chart.  Thank you for this interesting consult.  I greatly enjoyed meeting Carlos Bennett and look forward to participating in their care.  A copy of this report was sent to the requesting provider on this date.  Electronically Signed: Docia Barrier 03/03/2017, 12:08 PM   I spent a total of 40 Minutes  in face to face in clinical consultation, greater than 50% of which was counseling/coordinating care for pleural effusion.

## 2017-03-03 NOTE — Care Management Note (Signed)
Case Management Note  Patient Details  Name: Carlos Bennett MRN: [id number] Date of Birth: [birth year]  Subjective/Objective:   83 y/o m admitted w/SOB. From home. PT cons-await recc.                 Action/Plan:d/c plan home.   Expected Discharge Date:                  Expected Discharge Plan:  Home/Self Care  In-House Referral:     Discharge planning Services  CM Consult  Post Acute Care Choice:    Choice offered to:     DME Arranged:    DME Agency:     HH Arranged:    HH Agency:     Status of Service:  In process, will continue to follow  If discussed at Long Length of Stay Meetings, dates discussed:    Additional Comments:  Dessa Phi, RN 03/03/2017, 1:19 PM

## 2017-03-04 ENCOUNTER — Ambulatory Visit
Admission: RE | Admit: 2017-03-04 | Discharge: 2017-03-04 | Disposition: A | Discharge status: Home / Self Care | Payer: Medicare Other | Source: Ambulatory Visit | Attending: Radiation Oncology | Admitting: Radiation Oncology

## 2017-03-04 ENCOUNTER — Inpatient Hospital Stay (HOSPITAL_COMMUNITY): Payer: Medicare Other

## 2017-03-04 ENCOUNTER — Encounter (HOSPITAL_COMMUNITY): Payer: Self-pay | Admitting: Interventional Radiology

## 2017-03-04 ENCOUNTER — Encounter: Payer: Medicare Other | Admitting: Thoracic Surgery (Cardiothoracic Vascular Surgery)

## 2017-03-04 DIAGNOSIS — I4891 Unspecified atrial fibrillation: Secondary | ICD-10-CM

## 2017-03-04 HISTORY — PX: IR US GUIDE BX ASP/DRAIN: IMG2392

## 2017-03-04 HISTORY — PX: IR GUIDED DRAIN W CATHETER PLACEMENT: IMG719

## 2017-03-04 LAB — COMPREHENSIVE METABOLIC PANEL
ALK PHOS: 89 U/L (ref 38–126)
ALT: 15 U/L — ABNORMAL LOW (ref 17–63)
ANION GAP: 6 (ref 5–15)
AST: 23 U/L (ref 15–41)
Albumin: 3.2 g/dL — ABNORMAL LOW (ref 3.5–5.0)
BUN: 27 mg/dL — ABNORMAL HIGH (ref 6–20)
CALCIUM: 8.7 mg/dL — AB (ref 8.9–10.3)
CO2: 30 mmol/L (ref 22–32)
Chloride: 103 mmol/L (ref 101–111)
Creatinine, Ser: 1.12 mg/dL (ref 0.61–1.24)
GFR, EST NON AFRICAN AMERICAN: 55 mL/min — AB (ref >60)
Glucose, Bld: 115 mg/dL — ABNORMAL HIGH (ref 65–99)
Potassium: 4.3 mmol/L (ref 3.5–5.1)
SODIUM: 139 mmol/L (ref 135–145)
TOTAL PROTEIN: 6.5 g/dL (ref 6.5–8.1)
Total Bilirubin: 0.5 mg/dL (ref 0.3–1.2)

## 2017-03-04 LAB — CBC
HCT: 37.8 % — ABNORMAL LOW (ref 39.0–52.0)
Hemoglobin: 12.2 g/dL — ABNORMAL LOW (ref 13.0–17.0)
MCH: 29.8 pg (ref 26.0–34.0)
MCHC: 32.3 g/dL (ref 30.0–36.0)
MCV: 92.4 fL (ref 78.0–100.0)
Platelets: 184 10*3/uL (ref 150–400)
RBC: 4.09 MIL/uL — ABNORMAL LOW (ref 4.22–5.81)
RDW: 13.8 % (ref 11.5–15.5)
WBC: 5.8 10*3/uL (ref 4.0–10.5)

## 2017-03-04 MED ORDER — CEFAZOLIN SODIUM-DEXTROSE 2-4 GM/100ML-% IV SOLN
INTRAVENOUS | Status: AC
  Administered 2017-03-04: 2000 mg
  Filled 2017-03-04: qty 100

## 2017-03-04 MED ORDER — MIDAZOLAM HCL 2 MG/2ML IJ SOLN
INTRAMUSCULAR | Status: AC
  Filled 2017-03-04: qty 2

## 2017-03-04 MED ORDER — FENTANYL CITRATE (PF) 100 MCG/2ML IJ SOLN
INTRAMUSCULAR | Status: AC
  Filled 2017-03-04: qty 2

## 2017-03-04 MED ORDER — METOPROLOL TARTRATE 25 MG PO TABS
12.5000 mg | ORAL_TABLET | Freq: Two times a day (BID) | ORAL | Status: DC
  Administered 2017-03-04 – 2017-03-05 (×2): 12.5 mg via ORAL
  Filled 2017-03-04 (×2): qty 1

## 2017-03-04 MED ORDER — RIVAROXABAN 15 MG PO TABS: 15.0000 mg | ORAL_TABLET | Freq: Every day | ORAL | Status: DC

## 2017-03-04 MED ORDER — FENTANYL CITRATE (PF) 100 MCG/2ML IJ SOLN
INTRAMUSCULAR | Status: AC | PRN
  Administered 2017-03-04: 50 ug via INTRAVENOUS

## 2017-03-04 MED ORDER — BUPROPION HCL ER (SR) 150 MG PO TB12
150.0000 mg | ORAL_TABLET | Freq: Two times a day (BID) | ORAL | Status: DC
  Administered 2017-03-04 – 2017-03-05 (×3): 150 mg via ORAL
  Filled 2017-03-04 (×3): qty 1

## 2017-03-04 MED ORDER — MIDAZOLAM HCL 2 MG/2ML IJ SOLN
INTRAMUSCULAR | Status: AC | PRN
  Administered 2017-03-04: 0.5 mg via INTRAVENOUS

## 2017-03-04 NOTE — Procedures (Signed)
Interventional Radiology Procedure Note  Procedure: Right pleural tunneled PleurX catheter placement  Complications: None  Estimated Blood Loss: < 10 mL  15.5 Fr PleurX catheter placed.  2.8 L of pleural fluid removed after placement.  Venetia Night. Kathlene Cote, M.D Pager:  [phone]

## 2017-03-04 NOTE — Progress Notes (Signed)
PT Cancellation Note  Patient Details Name: Carlos Bennett MRN: [id number] DOB: 28-Jan-1924   Cancelled Treatment:    Reason Eval/Treat Not Completed: Medical issues which prohibited therapy . Noted Pleurex catheter to be placed today possibly. Will check back after procedure.  Claretha Cooper 03/04/2017, 6:59 AM Tresa Endo PT [phone]

## 2017-03-04 NOTE — Plan of Care (Signed)
Problem: Safety: Goal: Ability to remain free from injury will improve Outcome: Not Progressing Pt will try to get up without assistance

## 2017-03-04 NOTE — Progress Notes (Signed)
ANTICOAGULATION CONSULT NOTE - Initial Consult  Pharmacy Consult for Xarelto Indication: atrial fibrillation  No Known Allergies  Patient Measurements: Height: 5' 6.5" (168.9 cm) Weight: 133 lb 6.1 oz (60.5 kg) IBW/kg (Calculated) : 64.95  Vital Signs: Temp: 98.3 F (36.8 C) (09/18 1719) Temp Source: Oral (09/18 1719) BP: 128/67 (09/18 1719) Pulse Rate: 96 (09/18 1719)  Labs:  Recent Labs  03/02/17 1440 03/02/17 1615 03/03/17 0829 03/04/17 0522  HGB 13.6  --  12.6* 12.2*  HCT 40.3  --  37.7* 37.8*  PLT 291  --  194 184  CREATININE  --  0.99 1.20 1.12    Estimated Creatinine Clearance: 36.8 mL/min (by C-G formula based on SCr of 1.12 mg/dL).   Medical History: Past Medical History:  Diagnosis Date  . Adenocarcinoma of right lung, stage 4 ([place]) 02/12/2017  . Anxiety   . Arthritis   . Depression   . Hypertension   . Kidney stone     Assessment: 77 yoM with atrial fibrillation. Request to start Xarelto.  Dose reduction required for CrCl~36 ml/min.  Hgb low/stable and platelets WNL.  Goal of Therapy:  Prevention of stroke and systemic embolism   Plan:  Xarelto 15 mg daily with supper. Will provide Xarelto education prior to discharge.  Hershal Coria 03/04/2017,6:05 PM

## 2017-03-04 NOTE — Progress Notes (Signed)
Patient ID: Carlos Bennett, male   DOB: November 19, 1923, 83 y.o.   MRN: [id number]                                                                PROGRESS NOTE                                                                                                                                                                                                             Patient Demographics:    Carlos Bennett, is a 83 y.o. male, DOB - [birth year], QPY:[id number]  Admit date - 03/02/2017   Admitting Physician Gennaro Africa, MD  Outpatient Primary MD for the patient is Patient, No Pcp Per  LOS - 2  Outpatient Specialists:     Chief Complaint  Patient presents with  . Shortness of Breath    cancer patient        Brief Narrative    83 yo male with hx of lung cancer w/mets with known massive R.side pleural effusion , likely malignant, with plan for radiation and thoracentesis next week who came today with cc of dyspnea. This has been going on for weeks but worsened over the last couple of days and daughter was not comfortable waiting till next week. Pt denied any fever/chills/cough but has had constant chest pain esp in the right side and also has back pain. In ROS they also reported hip and leg pain w/o motor or sensory defects. No other complaints.    Subjective:    Carlos Bennett today breathing is stable.   , No headache, No chest pain, No abdominal pain - No Nausea, No new weakness tingling or numbness, No Cough - SOB.    Assessment  & Plan :    Active Problems:   Dyspnea   Malignant pleural effusion   Dyspnea:  Likely due to large R.pleural effusion and adenocarcinoma of lung Consult to IR for R.pleural thoracentesis ( discuss port placement with oncology) I have asked NP for radiology to discuss with oncology whether they want thoracentesis vs pleurex.  Family desires Pleurex Pleurex was placed 9/18 Albuterol prn Pt sat > 92 on RA  Metastatic lung cancer Radiology oncology input  requested 9/17  EKG with afib:  No prior hx in records [facility]VASC>2 Echo 9/17 => 55%-60%,  Unlikely  to be a candidate for full AC (per prior physician) I had a long discussion with patient and also his daughter They would like anticoagulation Will initiate xarelto Start metoprolol 12.5mg  po bid  HTN:  STOP hydralazine since adding metoprolol     Code Status :  DNR  Family Communication  : w daughter, and patient  Disposition Plan  : home  Barriers For Discharge :   Consults  :  Interventional radiology  Procedures  : IR thoracentesis  DVT Prophylaxis  :   Heparin - SCDs       Lab Results  Component Value Date   PLT 184 03/04/2017      Anti-infectives    None        Objective:   Vitals:   03/03/17 0604 03/03/17 1507 03/03/17 2016 03/04/17 0448  BP: (!) 147/81 129/70 116/68 139/85  Pulse: 100 (!) 110 (!) 106 (!) 116  Resp: 20 19 18 18   Temp: 98.9 F (37.2 C) 97.8 F (36.6 C) 99.1 F (37.3 C) 97.9 F (36.6 C)  TempSrc: Oral Oral Oral Oral  SpO2: 98% 100% 97% 96%  Weight:      Height:        Wt Readings from Last 3 Encounters:  03/02/17 60.5 kg (133 lb 6.1 oz)  02/19/17 60.1 kg (132 lb 6.4 oz)  02/26/17 59.9 kg (132 lb)     Intake/Output Summary (Last 24 hours) at 03/04/17 0805 Last data filed at 03/04/17 0000  Gross per 24 hour  Intake              180 ml  Output              252 ml  Net              -72 ml     Physical Exam  Awake Alert, Oriented X 3, No new F.N deficits, Normal affect [place].AT,PERRAL Supple Neck,No JVD, No cervical lymphadenopathy appriciated.  Symmetrical Chest wall movement, Good air movement bilaterally, CTAB RRR,No Gallops,Rubs or new Murmurs, No Parasternal Heave +ve B.Sounds, Abd Soft, No tenderness, No organomegaly appriciated, No rebound - guarding or rigidity. No Cyanosis, Clubbing or edema, No new Rash or bruise      Data Review:    CBC  Recent Labs Lab 03/02/17 1440 03/03/17 0829  03/04/17 0522  WBC 6.0 5.6 5.8  HGB 13.6 12.6* 12.2*  HCT 40.3 37.7* 37.8*  PLT 291 194 184  MCV 91.4 90.8 92.4  MCH 30.8 30.4 29.8  MCHC 33.7 33.4 32.3  RDW 13.8 13.8 13.8    Chemistries   Recent Labs Lab 03/02/17 1615 03/03/17 0829 03/04/17 0522  NA 135 139 139  K 4.0 4.2 4.3  CL 100* 102 103  CO2 27 28 30   GLUCOSE 99 106* 115*  BUN 18 24* 27*  CREATININE 0.99 1.20 1.12  CALCIUM 8.8* 9.0 8.7*  AST  --  19 23  ALT  --  14* 15*  ALKPHOS  --  86 89  BILITOT  --  0.9 0.5   ------------------------------------------------------------------------------------------------------------------ No results for input(s): CHOL, HDL, LDLCALC, TRIG, CHOLHDL, LDLDIRECT in the last 72 hours.  No results found for: HGBA1C ------------------------------------------------------------------------------------------------------------------ No results for input(s): TSH, T4TOTAL, T3FREE, THYROIDAB in the last 72 hours.  Invalid input(s): FREET3 ------------------------------------------------------------------------------------------------------------------ No results for input(s): VITAMINB12, FOLATE, FERRITIN, TIBC, IRON, RETICCTPCT in the last 72 hours.  Coagulation profile No results for input(s): INR, PROTIME in the last 168 hours.  No results  for input(s): DDIMER in the last 72 hours.  Cardiac Enzymes No results for input(s): CKMB, TROPONINI, MYOGLOBIN in the last 168 hours.  Invalid input(s): CK ------------------------------------------------------------------------------------------------------------------ No results found for: BNP  Inpatient Medications  Scheduled Meds: . buPROPion  150 mg Oral BID  . heparin  5,000 Units Subcutaneous Q8H  . hydrALAZINE  25 mg Oral BID  . Influenza vac split quadrivalent PF  0.5 mL Intramuscular Tomorrow-1000  . losartan  50 mg Oral Daily  . mirtazapine  15 mg Oral QHS  . pneumococcal 23 valent vaccine  0.5 mL Intramuscular  Tomorrow-1000   Continuous Infusions: PRN Meds:.albuterol, dextromethorphan, HYDROcodone-acetaminophen, morphine injection, senna-docusate  Micro Results No results found for this or any previous visit (from the past 240 hour(s)).  Radiology Reports Dg Chest 2 View  Result Date: 03/02/2017 CLINICAL DATA:  Chest pain and shortness of Breath EXAM: CHEST  2 VIEW COMPARISON:  None. FINDINGS: The heart size appears normal. There is aortic atherosclerosis. Near complete opacification of the right hemithorax secondary to malignant right pleural effusion. Aeration to the right lung is similar to 02/26/2017. Left lung is clear. IMPRESSION: 1. Similar appearance of near complete opacification of the right lung. Electronically Signed   By: Kerby Moors M.D.   On: 03/02/2017 14:23   Mr Jeri Cos PI Contrast  Result Date: 02/26/2017 CLINICAL DATA:  New diagnosis stage or non-small-cell lung carcinoma with thoracic metastatic disease. EXAM: MRI HEAD WITHOUT AND WITH CONTRAST TECHNIQUE: Multiplanar, multiecho pulse sequences of the brain and surrounding structures were obtained without and with intravenous contrast. CONTRAST:  31mL MULTIHANCE GADOBENATE DIMEGLUMINE 529 MG/ML IV SOLN COMPARISON:  None. FINDINGS: Brain: Diffusion imaging does not show any acute or subacute infarction. There chronic small-vessel ischemic changes affecting the pons, cerebellum an within the cerebral hemispheric white matter. No cortical or large vessel territory infarction. No mass lesion, hemorrhage, hydrocephalus or extra-axial collection. No abnormal contrast enhancement. Vascular: Major vessels at the base of the brain show flow. Skull and upper cervical spine: Heterogeneous marrow pattern in the upper cervical spine. Metastatic disease to that region not excluded. No calvarial metastasis identified. Sinuses/Orbits: Clear/normal Other: None IMPRESSION: No evidence of cranial or intracranial metastatic disease. Chronic small-vessel  ischemic changes. Heterogeneous marrow pattern in the upper cervical spine. Nonspecific, but early metastatic disease not excluded. Electronically Signed   By: Nelson Chimes M.D.   On: 02/26/2017 13:12   Mr Thoracic Spine W Wo Contrast  Result Date: 02/28/2017 CLINICAL DATA:  Non-small cell lung cancer. Pretreatment evaluation. EXAM: MRI THORACIC WITHOUT AND WITH CONTRAST TECHNIQUE: Multiplanar and multiecho pulse sequences of the thoracic spine were obtained without and with intravenous contrast. CONTRAST:  46mL MULTIHANCE GADOBENATE DIMEGLUMINE 529 MG/ML IV SOLN COMPARISON:  PET-CT 02/26/2017 FINDINGS: MRI THORACIC SPINE FINDINGS Alignment:  Unremarkable Vertebrae: Infiltrative marrow signal abnormality throughout the T10 and T11 bodies, with rounded lytic areas in the posterior body at both levels, containing partially cystic material with some fluid fluid levels. The posterior cortex is eroded at both levels with ventral epidural tumor infiltration that is right eccentric. Epidural tumor is greater at T10 where the thecal sac is posteriorly displaced to contact the non compressed cord and the right ventral nerve root. The foramina are patent. Cord: No evidence of intrathecal metastasis. No cord signal abnormality. Posterior right fourth rib metastasis, transcortical, best seen on axial slices. Paraspinal and other soft tissues: Large right pleural effusion with near complete right lung collapse. The mediastinum is shifted to the left. These  features were also seen on recent PET-CT. Disc levels: Generalized thoracic disc narrowing and endplate spurring. No degenerative impingement. IMPRESSION: 1. T10 and T11 body metastases with dorsal cortical erosion and right eccentric ventral epidural tumor spread. At T10 tumor displaces the thecal sac to the level of the cord, without compression. 2. Right posterior fourth rib metastasis. 3. Known large right pleural effusion with lung collapse and mediastinal shift.  Electronically Signed   By: Monte Fantasia M.D.   On: 02/28/2017 08:54   Nm Pet Image Initial (pi) Skull Base To Thigh  Result Date: 02/26/2017 CLINICAL DATA:  Subsequent treatment strategy for metastatic lung cancer. EXAM: NUCLEAR MEDICINE PET SKULL BASE TO THIGH TECHNIQUE: 6.8 mCi F-18 FDG was injected intravenously. Full-ring PET imaging was performed from the skull base to thigh after the radiotracer. CT data was obtained and used for attenuation correction and anatomic localization. FASTING BLOOD GLUCOSE:  Value: 110 mg/dl COMPARISON:  [facility] examinations [facility] dated 01/31/2017 and 01/29/2017. FINDINGS: NECK No hypermetabolic lymph nodes in the neck. Anterior left thalamic lacunar infarct noted. Carotid atherosclerotic calcifications. Incidental failure of fusion of the posterior arch of C1. CHEST Large right pleural effusion with complete collapse of the right middle lobe and right lower lobe, and nearly complete collapse of the right upper lobe. There is mildly accentuated metabolic activity along the right pleural effusion compatible with malignant effusion, representative metabolic activity along the pleural margin with SUV approximately 2.4, pleural or adjacent parenchymal tumor along the right upper lobe medially has maximum standard uptake value of 7.3. There foci of tumor occludes the presence in the atelectatic portions of the right upper lobe, right middle lobe, and right lower lobe. Dominant involvement medially in the right middle lobe has a maximum SUV of 13.2 and measures about 3.2 cm in long axis. Given the tumor adjacent to the right hilum, a component of right hilar involvement is not excluded. Small calcified nodules in the left lung are compatible with old granulomatous disease and are not hypermetabolic. 3 mm left upper lobe nodule on image 14/8 is not calcified and has no measurable hypermetabolic activity. Coronary, aortic arch, and branch vessel atherosclerotic  vascular disease. Calcified hilar and mediastinal lymph nodes are likely from old granulomatous disease. Shift of cardiac and mediastinal structures to the left. ABDOMEN/PELVIS 8 mm right adrenal nodule on image 114/4 has faintly accentuated metabolic activity with maximum SUV 3.8, early metastatic disease not excluded. Aortoiliac atherosclerotic vascular disease. Infrarenal abdominal aortic aneurysm 3.1 cm in diameter on image 132/4. Sigmoid colon diverticulosis. Prominent stool throughout the colon favors constipation. Prostatomegaly. SKELETON Scattered hypermetabolic lytic lesions involving the thoracic spine, lumbar spine, ribs, right iliac bone, left iliac bone, right sacrum, and right superior pubic ramus. The dominant lesion at the T10 vertebral level destroys the posterior aspect of the vertebral body and appears to be invading the epidural space, cord compression not excluded. Similarly at T11 the posterior vertebral cortex is destroyed and there could be early epidural invasion. At T10 the maximum SUV is 15.2. IMPRESSION: 1. Malignant right pleural effusion with scattered hypermetabolic lesions in all 3 lobes of the right lung, as well as scattered osseous metastatic lesions. Osseous metastatic lesions at T10 and T11 appear to be invading through the posterior vertebral body and into the epidural space can't could cause cord compression- thoracic spine MRI with and without contrast should be strongly considered for definitive characterization. Most of the right lung is collapsed, except for a small portion of  the right upper lobe. 2. Small faintly hypermetabolic nodule in the right adrenal gland may represent a metastatic nodule. 3. Large right pleural effusion causes displacement of mediastinal and cardiac structures to the left. 4. 3.1 cm infrarenal abdominal aortic aneurysm. Recommend followup by [place] in 3 years. This recommendation follows ACR consensus guidelines: White Paper of the ACR Incidental  Findings Committee II on Vascular Findings. J Am Coll Radiol 2013; 55:374-827 5. Other imaging findings of potential clinical significance: Aortic Atherosclerosis (ICD10-I70.0). Coronary atherosclerosis. Sigmoid colon diverticulosis. Prominent stool throughout the colon favors constipation. Prostatomegaly. Electronically Signed   By: Van Clines M.D.   On: 02/26/2017 11:29    Time Spent in minutes  30   Jani Gravel M.D on 03/04/2017 at 8:05 AM  Between 7am to 7pm - Pager - [phone]  After 7pm go to [email or web address] - password [facility]  [facility] -  Office  [phone]

## 2017-03-05 ENCOUNTER — Encounter: Payer: Self-pay | Admitting: Radiation Oncology

## 2017-03-05 ENCOUNTER — Ambulatory Visit
Admission: RE | Admit: 2017-03-05 | Discharge: 2017-03-05 | Disposition: A | Discharge status: Home / Self Care | Payer: Medicare Other | Source: Ambulatory Visit | Attending: Radiation Oncology | Admitting: Radiation Oncology

## 2017-03-05 DIAGNOSIS — J910 Malignant pleural effusion: Secondary | ICD-10-CM

## 2017-03-05 DIAGNOSIS — K5903 Drug induced constipation: Secondary | ICD-10-CM

## 2017-03-05 DIAGNOSIS — C7951 Secondary malignant neoplasm of bone: Secondary | ICD-10-CM

## 2017-03-05 DIAGNOSIS — G893 Neoplasm related pain (acute) (chronic): Secondary | ICD-10-CM

## 2017-03-05 MED ORDER — HYDROCODONE-ACETAMINOPHEN 5-325 MG PO TABS: 1.0000 | ORAL_TABLET | ORAL | 0 refills | Status: AC | PRN

## 2017-03-05 NOTE — Care Management Note (Signed)
Case Management Note  Patient Details  Name: Carlos Bennett MRN: [id number] Date of Birth: Oct 13, 1923  Subjective/Objective: Received home hospice referral-dtr chose [facility]-rep Bambi aware of referral & d/c today. Noted per nursing no home 02 needed, even though we have a home 02 order, & dtr prefers not to have home 02 currently-Rep for community home care is aware. dme-bedside table,w/c.dtr will transport home on own. No further CM needs.                   Action/Plan:d/c home w/hospice   Expected Discharge Date:  03/05/17               Expected Discharge Plan:  Home w Hospice Care  In-House Referral:     Discharge planning Services  CM Consult  Post Acute Care Choice:    Choice offered to:     DME Arranged:    DME Agency:     HH Arranged:    [place] Agency:     Status of Service:  Completed, signed off  If discussed at [employer], dates discussed:    Additional Comments:  Dessa Phi, RN 03/05/2017, 2:49 PM

## 2017-03-05 NOTE — Progress Notes (Signed)
Referring Physician(s): Kim,J  Supervising Physician: Arne Cleveland  Patient Status:  Carlos Bennett - In-pt  Chief Complaint: Lung cancer, right pleural effusion   Subjective:  Pt breathing better this am; no obvious distress; minimal soreness at pleurx site  Allergies: Patient has no known allergies.  Medications: Prior to Admission medications   Medication Sig Start Date End Date Taking? Authorizing Provider  albuterol (PROVENTIL HFA;VENTOLIN HFA) 108 (90 Base) MCG/ACT inhaler Inhale into the lungs every 6 (six) hours as needed for wheezing or shortness of breath.   Yes [provider]  buPROPion (ZYBAN) 150 MG 12 hr tablet Take 150 mg by mouth daily.   Yes [provider]  dextromethorphan 7.5 MG/5ML SYRP Take 7.5 mg by mouth every 6 (six) hours as needed (cough).   Yes [provider]  diclofenac sodium (VOLTAREN) 1 % GEL Apply 2 g topically 4 (four) times daily.   Yes [provider]  Glycerin, Laxative, (GLYCERIN ADULT) 2 g SUPP Place 1 suppository rectally once as needed for moderate constipation.   Yes [provider]  hydrALAZINE (APRESOLINE) 25 MG tablet Take 25 mg by mouth 2 (two) times daily.   Yes [provider]  HYDROcodone-acetaminophen (NORCO) 5-325 MG tablet Take 1 tablet by mouth every 4 (four) hours as needed for moderate pain. Patient taking differently: Take 0.5-1 tablets by mouth every 4 (four) hours as needed for moderate pain.  02/18/17  Yes Curt Bears, MD  hydrocortisone 2.5 % cream Apply topically 2 (two) times daily.   Yes [provider]  losartan (COZAAR) 50 MG tablet Take 50 mg by mouth daily.   Yes [provider]  magnesium citrate SOLN Take 0.5 Bottles by mouth once as needed for severe constipation.   Yes [provider]  mirtazapine (REMERON) 15 MG tablet Take 15 mg by mouth at bedtime.   Yes [provider]  senna-docusate (SENOKOT-S) 8.6-50 MG tablet Take  1 tablet by mouth daily as needed for mild constipation.   Yes [provider]  sodium phosphate Pediatric (FLEET) 3.5-9.5 GM/59ML enema Place 1 enema rectally once as needed for severe constipation.   Yes [provider]     Vital Signs: BP (!) 174/82 (BP Location: Left Arm)   Pulse 82   Temp 97.7 F (36.5 C) (Oral)   Resp 18   Ht 5' 6.5" (1.689 m)   Wt 133 lb 6.1 oz (60.5 kg)   SpO2 98%   BMI 21.21 kg/m   Physical Exam  rt pleurx drain intact, dressing dry, minimal tenderness  Imaging: Dg Chest 2 View  Result Date: 03/02/2017 CLINICAL DATA:  Chest pain and shortness of Breath EXAM: CHEST  2 VIEW COMPARISON:  None. FINDINGS: The heart size appears normal. There is aortic atherosclerosis. Near complete opacification of the right hemithorax secondary to malignant right pleural effusion. Aeration to the right lung is similar to 02/26/2017. Left lung is clear. IMPRESSION: 1. Similar appearance of near complete opacification of the right lung. Electronically Signed   By: Kerby Moors M.D.   On: 03/02/2017 14:23   Ir Guided Niel Hummer W Catheter Placement  Result Date: 03/04/2017 CLINICAL DATA:  Lung carcinoma with recurrent malignant right pleural effusion. EXAM: INSERTION OF TUNNELED PLEURAL DRAINAGE CATHETER ANESTHESIA/SEDATION: 0.5 mg IV Versed; 50 mcg IV Fentanyl. Total Moderate Sedation Time 27 minutes. The patient's level of consciousness and physiologic status were continuously monitored during the procedure by Radiology nursing. MEDICATIONS: 2 g IV Ancef. As antibiotic prophylaxis,  Ancef was ordered pre-procedure and administered intravenously within one hour of incision. FLUOROSCOPY TIME:  12 seconds.  2.8 mGy. PROCEDURE: The procedure, risks, benefits, and alternatives were explained to the patient. Questions regarding the procedure were encouraged and answered. The patient understands and consents to the procedure. A time-out was performed prior to initiating the  procedure. Ultrasound was used to localize right pleural fluid. The right chest wall was prepped with chlorhexidine in a sterile fashion, and a sterile drape was applied covering the operative field. A sterile gown and sterile gloves were used for the procedure. Local anesthesia was provided with 1% Lidocaine. Ultrasound image documentation was performed. Fluoroscopy during the procedure and fluoroscopic spot radiograph confirms appropriate catheter position. After creating a small skin incision, a 19 gauge needle was advanced into the pleural cavity under ultrasound guidance. A guide wire was then advanced under fluoroscopy into the pleural space. Pleural access was dilated serially and a 16-French peel-away sheath placed. A 15.5 French tunneled PleurX catheter was placed. This was tunneled from an incision 5 cm below the pleural access to the access site. The catheter was advanced through the peel-away sheath. The sheath was then removed. Final catheter positioning was confirmed with a fluoroscopic spot image. The access incision was closed with subcutaneous 3-0 Monocryl and subcuticular 4-0 Vicryl. Dermabond was applied to the incision. A Prolene retention suture was applied at the catheter exit site. Large volume thoracentesis was performed through vacuum bottles. COMPLICATIONS: None. FINDINGS: The catheter was placed via the right lateral chest wall. Catheter course is along the base of the hemithorax. Approximately 2.8 liters of pleural fluid was able to be removed after catheter placement. IMPRESSION: Placement of permanent, tunneled right pleural drainage catheter via lateral approach. 2.8 liters of pleural fluid was removed today after catheter placement. Electronically Signed   By: Aletta Edouard M.D.   On: 03/04/2017 16:42   Ir US Guide Bx Asp/drain  Result Date: 03/04/2017 CLINICAL DATA:  Lung carcinoma with recurrent malignant right pleural effusion. EXAM: INSERTION OF TUNNELED PLEURAL DRAINAGE  CATHETER ANESTHESIA/SEDATION: 0.5 mg IV Versed; 50 mcg IV Fentanyl. Total Moderate Sedation Time 27 minutes. The patient's level of consciousness and physiologic status were continuously monitored during the procedure by Radiology nursing. MEDICATIONS: 2 g IV Ancef. As antibiotic prophylaxis, Ancef was ordered pre-procedure and administered intravenously within one hour of incision. FLUOROSCOPY TIME:  12 seconds.  2.8 mGy. PROCEDURE: The procedure, risks, benefits, and alternatives were explained to the patient. Questions regarding the procedure were encouraged and answered. The patient understands and consents to the procedure. A time-out was performed prior to initiating the procedure. Ultrasound was used to localize right pleural fluid. The right chest wall was prepped with chlorhexidine in a sterile fashion, and a sterile drape was applied covering the operative field. A sterile gown and sterile gloves were used for the procedure. Local anesthesia was provided with 1% Lidocaine. Ultrasound image documentation was performed. Fluoroscopy during the procedure and fluoroscopic spot radiograph confirms appropriate catheter position. After creating a small skin incision, a 19 gauge needle was advanced into the pleural cavity under ultrasound guidance. A guide wire was then advanced under fluoroscopy into the pleural space. Pleural access was dilated serially and a 16-French peel-away sheath placed. A 15.5 French tunneled PleurX catheter was placed. This was tunneled from an incision 5 cm below the pleural access to the access site. The catheter was advanced through the peel-away sheath. The sheath was then removed. Final catheter positioning was confirmed with  a fluoroscopic spot image. The access incision was closed with subcutaneous 3-0 Monocryl and subcuticular 4-0 Vicryl. Dermabond was applied to the incision. A Prolene retention suture was applied at the catheter exit site. Large volume thoracentesis was  performed through vacuum bottles. COMPLICATIONS: None. FINDINGS: The catheter was placed via the right lateral chest wall. Catheter course is along the base of the hemithorax. Approximately 2.8 liters of pleural fluid was able to be removed after catheter placement. IMPRESSION: Placement of permanent, tunneled right pleural drainage catheter via lateral approach. 2.8 liters of pleural fluid was removed today after catheter placement. Electronically Signed   By: Aletta Edouard M.D.   On: 03/04/2017 16:42    Labs:  CBC:  Recent Labs  02/12/17 1341 03/02/17 1440 03/03/17 0829 03/04/17 0522  WBC 6.4 6.0 5.6 5.8  HGB 12.8* 13.6 12.6* 12.2*  HCT 39.5 40.3 37.7* 37.8*  PLT 221 291 194 184    COAGS: No results for input(s): INR, APTT in the last 8760 hours.  BMP:  Recent Labs  02/12/17 1341 03/02/17 1615 03/03/17 0829 03/04/17 0522  NA 138 135 139 139  K 4.8 4.0 4.2 4.3  CL  --  100* 102 103  CO2 28 27 28 30   GLUCOSE 93 99 106* 115*  BUN 24.2 18 24* 27*  CALCIUM 9.4 8.8* 9.0 8.7*  CREATININE 1.1 0.99 1.20 1.12  GFRNONAA  --  >60 51* 55*  GFRAA  --  >60 59* >60    LIVER FUNCTION TESTS:  Recent Labs  02/12/17 1341 03/03/17 0829 03/04/17 0522  BILITOT 0.48 0.9 0.5  AST 18 19 23   ALT 11 14* 15*  ALKPHOS 108 86 89  PROT 7.3 6.6 6.5  ALBUMIN 3.3* 3.4* 3.2*    Assessment and Plan: Stage 4 lung cancer, recurrent right pleural effusion; s/p right pleurx cath with removal of 2.8 liters fluid 9/18; stable; AF; drain effusion prn; other plans as per primary team   Electronically Signed: D. Rowe Robert, PA-C 03/05/2017, 9:20 AM   I spent a total of 15 minutes at the the patient's bedside AND on the patient's hospital floor or unit, greater than 50% of which was counseling/coordinating care for right pleurx catheter    Patient ID: Carlos Bennett, male   DOB: September 06, 1923, 83 y.o.   MRN: [id number]

## 2017-03-05 NOTE — Progress Notes (Signed)
°  Radiation Oncology         [phone]) [phone] ________________________________  Name: Carlos Bennett MRN: [id number]  Date: 03/05/2017  DOB: 07-15-1923  End of Treatment Note  Diagnosis:   83 y.o. gentleman with recently diagnosed stage IV (T1a, NX, M1b) non-small cell lung cancer, adenocarcinoma with T10 and T11 metastases     Indication for treatment:  Palliative       Radiation treatment dates:   02/20/2017 to 03/05/2017  Site/dose:   The Spine T9-12 was treated to 30 Gy in 10 fractions of 3 Gy.   Beams/energy:  Static // 15X  Narrative: The patient tolerated radiation treatment. He was hospitalized three days ago with dyspnea.  He had 3.5 liters of fluid pulled off his lung via thoracentisis yesterday and dyspnea resolved thereafter. He reports the thoracic spine pain is intermittent and relatively unchanged and is unable to describe or rate the pain. He reports a cough which is occasionally productive with scant hemoptysis. A foley catheter is in place and draining appropriately with dark yellow urine noted. He continues to struggle with constipation.   Reviewed recent PET and MRI results with patient and family.  Plan: The patient has completed radiation treatment. The patient has a relatively low performance status and will be transitioned into hospice care. The patient will return for follow-up in one month. I advised him to call or return sooner if he has any questions or concerns related to his recovery or treatment. ________________________________  Sheral Apley. Tammi Klippel, M.D.   This document serves as a record of services personally performed by Tyler Pita, MD. It was created on his behalf by Arlyce Harman, a trained medical scribe. The creation of this record is based on the scribe's personal observations and the provider's statements to them. This document has been checked and approved by the attending provider.

## 2017-03-05 NOTE — Care Management Important Message (Signed)
Important Message  Patient Details  Name: Carlos Bennett MRN: [id number] Date of Birth: 1923-09-14   Medicare Important Message Given:  Yes    Kerin Salen 03/05/2017, 10:26 AMImportant Message  Patient Details  Name: Carlos Bennett MRN: [id number] Date of Birth: 03/29/1924   Medicare Important Message Given:  Yes    Kerin Salen 03/05/2017, 10:26 AM

## 2017-03-05 NOTE — Evaluation (Signed)
Physical Therapy Evaluation Patient Details Name: Carlos Bennett MRN: [id number] DOB: 27-May-1924 Today's Date: 03/05/2017   History of Present Illness   83 yo male with hx of lung cancer w/mets with known massive R.side pleural effusion , likely malignant, with plan for radiation and thoracentesis next week admitted with cc of dyspnea. s/p pleurX catheter placement 03/04/17  Clinical Impression  Pt did well with mobility, he ambulated 180' with RW without loss of balance, SaO2 94-97% on RA walking, HR 80s. He has 24* assistance from his daughter at home.  From PT standpoint, he is ready to DC home, no follow up PT needed. PT signing off.     Follow Up Recommendations No PT follow up    Equipment Recommendations  None recommended by PT    Recommendations for Other Services       Precautions / Restrictions Precautions Precautions: None Restrictions Weight Bearing Restrictions: No      Mobility  Bed Mobility Overal bed mobility: Needs Assistance Bed Mobility: Supine to Sit     Supine to sit: Min assist     General bed mobility comments: min A to raise trunk  Transfers Overall transfer level: Needs assistance Equipment used: Rolling walker (2 wheeled) Transfers: Sit to/from Stand Sit to Stand: Min guard         General transfer comment: no physical assist needed  Ambulation/Gait Ambulation/Gait assistance: Supervision Ambulation Distance (Feet): 180 Feet Assistive device: Rolling walker (2 wheeled) Gait Pattern/deviations: WFL(Within Functional Limits)     General Gait Details: SaO2 94-97% on RA walking, HR 80s, 1/4 dyspnea  Stairs            Wheelchair Mobility    Modified Rankin (Stroke Patients Only)       Balance Overall balance assessment: Modified Independent                                           Pertinent Vitals/Pain Pain Assessment: No/denies pain    Home Living Family/patient expects to be discharged to::  Private residence Living Arrangements: Children Available Help at Discharge: Available 24 hours/day;Family   Home Access: Stairs to enter Entrance Stairs-Rails: Right Entrance Stairs-Number of Steps: 2 Home Layout: One level Home Equipment: Walker - 2 wheels;Cane - single point;Bedside commode Additional Comments: hospice to deliver [facility]    Prior Function Level of Independence: Independent with assistive device(s)         Comments: used cane     Hand Dominance        Extremity/Trunk Assessment   Upper Extremity Assessment Upper Extremity Assessment: Overall WFL for tasks assessed    Lower Extremity Assessment Lower Extremity Assessment: Overall WFL for tasks assessed    Cervical / Trunk Assessment Cervical / Trunk Assessment: Normal  Communication      Cognition Arousal/Alertness: Awake/alert Behavior During Therapy: WFL for tasks assessed/performed Overall Cognitive Status: Within Functional Limits for tasks assessed                                        General Comments      Exercises     Assessment/Plan    PT Assessment Patent does not need any further PT services  PT Problem List         PT Treatment Interventions  PT Goals (Current goals can be found in the Care Plan section)  Acute Rehab PT Goals PT Goal Formulation: All assessment and education complete, DC therapy    Frequency     Barriers to discharge        Co-evaluation               AM-PAC PT "6 Clicks" Daily Activity  Outcome Measure Difficulty turning over in bed (including adjusting bedclothes, sheets and blankets)?: None Difficulty moving from lying on back to sitting on the side of the bed? : Unable Difficulty sitting down on and standing up from a chair with arms (e.g., wheelchair, bedside commode, etc,.)?: A Little Help needed moving to and from a bed to chair (including a wheelchair)?: A Little Help needed walking in hospital room?: A  Little Help needed climbing 3-5 steps with a railing? : A Little 6 Click Score: 17    End of Session Equipment Utilized During Treatment: Gait belt Activity Tolerance: Patient tolerated treatment well Patient left: in bed;with call bell/phone within reach;with bed alarm set Nurse Communication: Mobility status      Time: 2330-0762 PT Time Calculation (min) (ACUTE ONLY): 21 min   Charges:   PT Evaluation $PT Eval Low Complexity: 1 Low     PT G Codes:       [name] 03/05/2017, 2:45 PM  [phone]

## 2017-03-05 NOTE — Progress Notes (Signed)
PT Cancellation Note  Patient Details Name: Carlos Bennett MRN: [id number] DOB: [birth year]   Cancelled Treatment:    Reason Eval/Treat Not Completed: Patient at procedure or test/unavailable (@ radiation, will attempt later today.)   [name] 03/05/2017, 1:31 PM [phone]

## 2017-03-05 NOTE — Discharge Summary (Addendum)
Physician Discharge Summary  Rivka Barbara JGG:[id number] DOB: 1924/02/15 DOA: 03/02/2017  PCP: Patient, No Pcp Per  Admit date: 03/02/2017 Discharge date: 03/05/2017  Time spent: 45 minutes  Recommendations for Outpatient Follow-up:  1. [facility]  to evaluate and admit for Home Hospice services   Discharge Diagnoses:    Metastatic non small cell lung CA   Malignant pleural effusion   Paroxysmal Atrial fibrillation (HCC)   Severe malnutrition   HTN   Discharge Condition: poor  Diet recommendation: heart healthy  Filed Weights   03/02/17 2031  Weight: 60.5 kg (133 lb 6.1 oz)    History of present illness:   83 yo male with hx of lung cancer w/mets with known massive R.side pleural effusion , likely malignant, with plan for radiation and thoracentesis next week who came to ER with cc of dyspnea  Hospital Course:   1. Recurrent malignant effusion -s/p frequent thoracentesis for symptom control -finally IR consulted this time for pleurx catheter -pt and family decided to set up Home Hospice services, per daughter -pt decided against further Rx/XRt or chemo and they decide to proceed with Home Hospice services -prognosis felt to be <13months and this would seen appropriate, Home Hospice referral made at discharge  2. Metastatic non small cell Lung Ca -with malignant effusion, mets to bones/spine -see above discussion on home with Hospice  3. Severe malnutrition -comfort feeds as tolerated  4. Adult failure to thrive -due to above  5. Paroxysmal Atrial fibrillation -chadsvasc score >2 but with age and stage 4 Ca with poor prognosis I discussed with daughter and we stopped xarelto which was started yesterday  6. HTN -cozaar continued  Procedures: 9/18 Right pleural tunneled PleurX catheter placement  Consultations:  IR  Oncology  Discharge Exam: Vitals:   03/05/17 0655 03/05/17 1008  BP: (!) 174/82 (!) 168/80  Pulse:  88  Resp:    Temp:     SpO2:      General: AAOx2 Cardiovascular: S1S2/RRR Respiratory: decreased at R base  Discharge Instructions   Discharge Instructions    Diet - low sodium heart healthy    Complete by:  As directed    Diet - low sodium heart healthy    Complete by:  As directed    Increase activity slowly    Complete by:  As directed    Increase activity slowly    Complete by:  As directed      Current Discharge Medication List    CONTINUE these medications which have NOT CHANGED   Details  albuterol (PROVENTIL HFA;VENTOLIN HFA) 108 (90 Base) MCG/ACT inhaler Inhale into the lungs every 6 (six) hours as needed for wheezing or shortness of breath.   Associated Diagnoses: Adenocarcinoma of right lung, stage 4 ([place]); Malignant neoplasm of unspecified part of unspecified bronchus or lung (HCC)    buPROPion (ZYBAN) 150 MG 12 hr tablet Take 150 mg by mouth daily.    dextromethorphan 7.5 MG/5ML SYRP Take 7.5 mg by mouth every 6 (six) hours as needed (cough).    diclofenac sodium (VOLTAREN) 1 % GEL Apply 2 g topically 4 (four) times daily.    Glycerin, Laxative, (GLYCERIN ADULT) 2 g SUPP Place 1 suppository rectally once as needed for moderate constipation.    hydrALAZINE (APRESOLINE) 25 MG tablet Take 25 mg by mouth 2 (two) times daily.   Associated Diagnoses: Adenocarcinoma of right lung, stage 4 ([place]); Malignant neoplasm of unspecified part of unspecified bronchus or lung ([place])  HYDROcodone-acetaminophen (NORCO) 5-325 MG tablet Take 1 tablet by mouth every 4 (four) hours as needed for moderate pain. Qty: 30 tablet, Refills: 0    hydrocortisone 2.5 % cream Apply topically 2 (two) times daily.    losartan (COZAAR) 50 MG tablet Take 50 mg by mouth daily.    magnesium citrate SOLN Take 0.5 Bottles by mouth once as needed for severe constipation.    mirtazapine (REMERON) 15 MG tablet Take 15 mg by mouth at bedtime.    senna-docusate (SENOKOT-S) 8.6-50 MG tablet Take 1 tablet by mouth daily  as needed for mild constipation.    sodium phosphate Pediatric (FLEET) 3.5-9.5 GM/59ML enema Place 1 enema rectally once as needed for severe constipation.       No Known Allergies Follow-up Information    PCP Follow up in 1 week(s).            The results of significant diagnostics from this hospitalization (including imaging, microbiology, ancillary and laboratory) are listed below for reference.    Significant Diagnostic Studies: Dg Chest 2 View  Result Date: 03/02/2017 CLINICAL DATA:  Chest pain and shortness of Breath EXAM: CHEST  2 VIEW COMPARISON:  None. FINDINGS: The heart size appears normal. There is aortic atherosclerosis. Near complete opacification of the right hemithorax secondary to malignant right pleural effusion. Aeration to the right lung is similar to 02/26/2017. Left lung is clear. IMPRESSION: 1. Similar appearance of near complete opacification of the right lung. Electronically Signed   By: Kerby Moors M.D.   On: 03/02/2017 14:23   Mr Jeri Cos ZO Contrast  Result Date: 02/26/2017 CLINICAL DATA:  New diagnosis stage or non-small-cell lung carcinoma with thoracic metastatic disease. EXAM: MRI HEAD WITHOUT AND WITH CONTRAST TECHNIQUE: Multiplanar, multiecho pulse sequences of the brain and surrounding structures were obtained without and with intravenous contrast. CONTRAST:  61mL MULTIHANCE GADOBENATE DIMEGLUMINE 529 MG/ML IV SOLN COMPARISON:  None. FINDINGS: Brain: Diffusion imaging does not show any acute or subacute infarction. There chronic small-vessel ischemic changes affecting the pons, cerebellum an within the cerebral hemispheric white matter. No cortical or large vessel territory infarction. No mass lesion, hemorrhage, hydrocephalus or extra-axial collection. No abnormal contrast enhancement. Vascular: Major vessels at the base of the brain show flow. Skull and upper cervical spine: Heterogeneous marrow pattern in the upper cervical spine. Metastatic disease to  that region not excluded. No calvarial metastasis identified. Sinuses/Orbits: Clear/normal Other: None IMPRESSION: No evidence of cranial or intracranial metastatic disease. Chronic small-vessel ischemic changes. Heterogeneous marrow pattern in the upper cervical spine. Nonspecific, but early metastatic disease not excluded. Electronically Signed   By: Nelson Chimes M.D.   On: 02/26/2017 13:12   Mr Thoracic Spine W Wo Contrast  Result Date: 02/28/2017 CLINICAL DATA:  Non-small cell lung cancer. Pretreatment evaluation. EXAM: MRI THORACIC WITHOUT AND WITH CONTRAST TECHNIQUE: Multiplanar and multiecho pulse sequences of the thoracic spine were obtained without and with intravenous contrast. CONTRAST:  52mL MULTIHANCE GADOBENATE DIMEGLUMINE 529 MG/ML IV SOLN COMPARISON:  PET-CT 02/26/2017 FINDINGS: MRI THORACIC SPINE FINDINGS Alignment:  Unremarkable Vertebrae: Infiltrative marrow signal abnormality throughout the T10 and T11 bodies, with rounded lytic areas in the posterior body at both levels, containing partially cystic material with some fluid fluid levels. The posterior cortex is eroded at both levels with ventral epidural tumor infiltration that is right eccentric. Epidural tumor is greater at T10 where the thecal sac is posteriorly displaced to contact the non compressed cord and the right ventral nerve root. The foramina  are patent. Cord: No evidence of intrathecal metastasis. No cord signal abnormality. Posterior right fourth rib metastasis, transcortical, best seen on axial slices. Paraspinal and other soft tissues: Large right pleural effusion with near complete right lung collapse. The mediastinum is shifted to the left. These features were also seen on recent PET-CT. Disc levels: Generalized thoracic disc narrowing and endplate spurring. No degenerative impingement. IMPRESSION: 1. T10 and T11 body metastases with dorsal cortical erosion and right eccentric ventral epidural tumor spread. At T10 tumor  displaces the thecal sac to the level of the cord, without compression. 2. Right posterior fourth rib metastasis. 3. Known large right pleural effusion with lung collapse and mediastinal shift. Electronically Signed   By: Monte Fantasia M.D.   On: 02/28/2017 08:54   Ir Guided Niel Hummer W Catheter Placement  Result Date: 03/04/2017 CLINICAL DATA:  Lung carcinoma with recurrent malignant right pleural effusion. EXAM: INSERTION OF TUNNELED PLEURAL DRAINAGE CATHETER ANESTHESIA/SEDATION: 0.5 mg IV Versed; 50 mcg IV Fentanyl. Total Moderate Sedation Time 27 minutes. The patient's level of consciousness and physiologic status were continuously monitored during the procedure by Radiology nursing. MEDICATIONS: 2 g IV Ancef. As antibiotic prophylaxis, Ancef was ordered pre-procedure and administered intravenously within one hour of incision. FLUOROSCOPY TIME:  12 seconds.  2.8 mGy. PROCEDURE: The procedure, risks, benefits, and alternatives were explained to the patient. Questions regarding the procedure were encouraged and answered. The patient understands and consents to the procedure. A time-out was performed prior to initiating the procedure. Ultrasound was used to localize right pleural fluid. The right chest wall was prepped with chlorhexidine in a sterile fashion, and a sterile drape was applied covering the operative field. A sterile gown and sterile gloves were used for the procedure. Local anesthesia was provided with 1% Lidocaine. Ultrasound image documentation was performed. Fluoroscopy during the procedure and fluoroscopic spot radiograph confirms appropriate catheter position. After creating a small skin incision, a 19 gauge needle was advanced into the pleural cavity under ultrasound guidance. A guide wire was then advanced under fluoroscopy into the pleural space. Pleural access was dilated serially and a 16-French peel-away sheath placed. A 15.5 French tunneled PleurX catheter was placed. This was tunneled  from an incision 5 cm below the pleural access to the access site. The catheter was advanced through the peel-away sheath. The sheath was then removed. Final catheter positioning was confirmed with a fluoroscopic spot image. The access incision was closed with subcutaneous 3-0 Monocryl and subcuticular 4-0 Vicryl. Dermabond was applied to the incision. A Prolene retention suture was applied at the catheter exit site. Large volume thoracentesis was performed through vacuum bottles. COMPLICATIONS: None. FINDINGS: The catheter was placed via the right lateral chest wall. Catheter course is along the base of the hemithorax. Approximately 2.8 liters of pleural fluid was able to be removed after catheter placement. IMPRESSION: Placement of permanent, tunneled right pleural drainage catheter via lateral approach. 2.8 liters of pleural fluid was removed today after catheter placement. Electronically Signed   By: Aletta Edouard M.D.   On: 03/04/2017 16:42   Nm Pet Image Initial (pi) Skull Base To Thigh  Result Date: 02/26/2017 CLINICAL DATA:  Subsequent treatment strategy for metastatic lung cancer. EXAM: NUCLEAR MEDICINE PET SKULL BASE TO THIGH TECHNIQUE: 6.8 mCi F-18 FDG was injected intravenously. Full-ring PET imaging was performed from the skull base to thigh after the radiotracer. CT data was obtained and used for attenuation correction and anatomic localization. FASTING BLOOD GLUCOSE:  Value: 110 mg/dl COMPARISON:  CT examinations from [facility] dated 01/31/2017 and 01/29/2017. FINDINGS: NECK No hypermetabolic lymph nodes in the neck. Anterior left thalamic lacunar infarct noted. Carotid atherosclerotic calcifications. Incidental failure of fusion of the posterior arch of C1. CHEST Large right pleural effusion with complete collapse of the right middle lobe and right lower lobe, and nearly complete collapse of the right upper lobe. There is mildly accentuated metabolic activity along the right  pleural effusion compatible with malignant effusion, representative metabolic activity along the pleural margin with SUV approximately 2.4, pleural or adjacent parenchymal tumor along the right upper lobe medially has maximum standard uptake value of 7.3. There foci of tumor occludes the presence in the atelectatic portions of the right upper lobe, right middle lobe, and right lower lobe. Dominant involvement medially in the right middle lobe has a maximum SUV of 13.2 and measures about 3.2 cm in long axis. Given the tumor adjacent to the right hilum, a component of right hilar involvement is not excluded. Small calcified nodules in the left lung are compatible with old granulomatous disease and are not hypermetabolic. 3 mm left upper lobe nodule on image 14/8 is not calcified and has no measurable hypermetabolic activity. Coronary, aortic arch, and branch vessel atherosclerotic vascular disease. Calcified hilar and mediastinal lymph nodes are likely from old granulomatous disease. Shift of cardiac and mediastinal structures to the left. ABDOMEN/PELVIS 8 mm right adrenal nodule on image 114/4 has faintly accentuated metabolic activity with maximum SUV 3.8, early metastatic disease not excluded. Aortoiliac atherosclerotic vascular disease. Infrarenal abdominal aortic aneurysm 3.1 cm in diameter on image 132/4. Sigmoid colon diverticulosis. Prominent stool throughout the colon favors constipation. Prostatomegaly. SKELETON Scattered hypermetabolic lytic lesions involving the thoracic spine, lumbar spine, ribs, right iliac bone, left iliac bone, right sacrum, and right superior pubic ramus. The dominant lesion at the T10 vertebral level destroys the posterior aspect of the vertebral body and appears to be invading the epidural space, cord compression not excluded. Similarly at T11 the posterior vertebral cortex is destroyed and there could be early epidural invasion. At T10 the maximum SUV is 15.2. IMPRESSION: 1.  Malignant right pleural effusion with scattered hypermetabolic lesions in all 3 lobes of the right lung, as well as scattered osseous metastatic lesions. Osseous metastatic lesions at T10 and T11 appear to be invading through the posterior vertebral body and into the epidural space can't could cause cord compression- thoracic spine MRI with and without contrast should be strongly considered for definitive characterization. Most of the right lung is collapsed, except for a small portion of the right upper lobe. 2. Small faintly hypermetabolic nodule in the right adrenal gland may represent a metastatic nodule. 3. Large right pleural effusion causes displacement of mediastinal and cardiac structures to the left. 4. 3.1 cm infrarenal abdominal aortic aneurysm. Recommend followup by [place] in 3 years. This recommendation follows ACR consensus guidelines: White Paper of the ACR Incidental Findings Committee II on Vascular Findings. J Am Coll Radiol 2013; 32:440-102 5. Other imaging findings of potential clinical significance: Aortic Atherosclerosis (ICD10-I70.0). Coronary atherosclerosis. Sigmoid colon diverticulosis. Prominent stool throughout the colon favors constipation. Prostatomegaly. Electronically Signed   By: Van Clines M.D.   On: 02/26/2017 11:29   Ir US Guide Bx Asp/drain  Result Date: 03/04/2017 CLINICAL DATA:  Lung carcinoma with recurrent malignant right pleural effusion. EXAM: INSERTION OF TUNNELED PLEURAL DRAINAGE CATHETER ANESTHESIA/SEDATION: 0.5 mg IV Versed; 50 mcg IV Fentanyl. Total Moderate Sedation Time 27 minutes. The patient's level of consciousness and  physiologic status were continuously monitored during the procedure by Radiology nursing. MEDICATIONS: 2 g IV Ancef. As antibiotic prophylaxis, Ancef was ordered pre-procedure and administered intravenously within one hour of incision. FLUOROSCOPY TIME:  12 seconds.  2.8 mGy. PROCEDURE: The procedure, risks, benefits, and alternatives were  explained to the patient. Questions regarding the procedure were encouraged and answered. The patient understands and consents to the procedure. A time-out was performed prior to initiating the procedure. Ultrasound was used to localize right pleural fluid. The right chest wall was prepped with chlorhexidine in a sterile fashion, and a sterile drape was applied covering the operative field. A sterile gown and sterile gloves were used for the procedure. Local anesthesia was provided with 1% Lidocaine. Ultrasound image documentation was performed. Fluoroscopy during the procedure and fluoroscopic spot radiograph confirms appropriate catheter position. After creating a small skin incision, a 19 gauge needle was advanced into the pleural cavity under ultrasound guidance. A guide wire was then advanced under fluoroscopy into the pleural space. Pleural access was dilated serially and a 16-French peel-away sheath placed. A 15.5 French tunneled PleurX catheter was placed. This was tunneled from an incision 5 cm below the pleural access to the access site. The catheter was advanced through the peel-away sheath. The sheath was then removed. Final catheter positioning was confirmed with a fluoroscopic spot image. The access incision was closed with subcutaneous 3-0 Monocryl and subcuticular 4-0 Vicryl. Dermabond was applied to the incision. A Prolene retention suture was applied at the catheter exit site. Large volume thoracentesis was performed through vacuum bottles. COMPLICATIONS: None. FINDINGS: The catheter was placed via the right lateral chest wall. Catheter course is along the base of the hemithorax. Approximately 2.8 liters of pleural fluid was able to be removed after catheter placement. IMPRESSION: Placement of permanent, tunneled right pleural drainage catheter via lateral approach. 2.8 liters of pleural fluid was removed today after catheter placement. Electronically Signed   By: Aletta Edouard M.D.   On:  03/04/2017 16:42    Microbiology: No results found for this or any previous visit (from the past 240 hour(s)).   Labs: Basic Metabolic Panel:  Recent Labs Lab 03/02/17 1615 03/03/17 0829 03/04/17 0522  NA 135 139 139  K 4.0 4.2 4.3  CL 100* 102 103  CO2 27 28 30   GLUCOSE 99 106* 115*  BUN 18 24* 27*  CREATININE 0.99 1.20 1.12  CALCIUM 8.8* 9.0 8.7*   Liver Function Tests:  Recent Labs Lab 03/03/17 0829 03/04/17 0522  AST 19 23  ALT 14* 15*  ALKPHOS 86 89  BILITOT 0.9 0.5  PROT 6.6 6.5  ALBUMIN 3.4* 3.2*   No results for input(s): LIPASE, AMYLASE in the last 168 hours. No results for input(s): AMMONIA in the last 168 hours. CBC:  Recent Labs Lab 03/02/17 1440 03/03/17 0829 03/04/17 0522  WBC 6.0 5.6 5.8  HGB 13.6 12.6* 12.2*  HCT 40.3 37.7* 37.8*  MCV 91.4 90.8 92.4  PLT 291 194 184   Cardiac Enzymes: No results for input(s): CKTOTAL, CKMB, CKMBINDEX, TROPONINI in the last 168 hours. BNP: BNP (last 3 results) No results for input(s): BNP in the last 8760 hours.  ProBNP (last 3 results) No results for input(s): PROBNP in the last 8760 hours.  CBG: No results for input(s): GLUCAP in the last 168 hours.     SignedDomenic Polite MD.  [facility] 03/05/2017, 2:30 PM

## 2017-03-05 NOTE — Progress Notes (Signed)
If home w/hospice services needed-please put in Home hospice for choice referral for CM-MD notified. Patient does not have home hospice services currently-spoke to dtr-Annette-patient will stay w/her.Patient already has rw,3n1. dtr would like bedside table,w/c. Currently on 02.If home hospice @ d/c-Dr. Julien Nordmann would manage per dtr.Dtr will transport home on own. Await further notice on home hospice choice.

## 2017-03-10 ENCOUNTER — Ambulatory Visit: Payer: Self-pay | Admitting: Internal Medicine

## 2017-04-10 ENCOUNTER — Ambulatory Visit: Admit: 2017-04-10 | Payer: Medicare Other | Admitting: Urology

## 2017-05-01 ENCOUNTER — Encounter (HOSPITAL_COMMUNITY): Payer: Self-pay | Admitting: Family Medicine

## 2017-05-01 ENCOUNTER — Emergency Department (HOSPITAL_COMMUNITY)
Admission: EM | Admit: 2017-05-01 | Discharge: 2017-05-02 | Disposition: A | Discharge status: Home / Self Care | Payer: Medicare Other | Attending: Emergency Medicine | Admitting: Emergency Medicine

## 2017-05-01 DIAGNOSIS — I4891 Unspecified atrial fibrillation: Secondary | ICD-10-CM | POA: Insufficient documentation

## 2017-05-01 DIAGNOSIS — C7931 Secondary malignant neoplasm of brain: Secondary | ICD-10-CM | POA: Diagnosis not present

## 2017-05-01 DIAGNOSIS — Z87891 Personal history of nicotine dependence: Secondary | ICD-10-CM | POA: Insufficient documentation

## 2017-05-01 DIAGNOSIS — R4182 Altered mental status, unspecified: Secondary | ICD-10-CM | POA: Diagnosis not present

## 2017-05-01 DIAGNOSIS — I10 Essential (primary) hypertension: Secondary | ICD-10-CM | POA: Diagnosis not present

## 2017-05-01 DIAGNOSIS — F0390 Unspecified dementia without behavioral disturbance: Secondary | ICD-10-CM | POA: Diagnosis not present

## 2017-05-01 DIAGNOSIS — Z79899 Other long term (current) drug therapy: Secondary | ICD-10-CM | POA: Insufficient documentation

## 2017-05-01 DIAGNOSIS — R451 Restlessness and agitation: Secondary | ICD-10-CM | POA: Diagnosis present

## 2017-05-01 NOTE — ED Notes (Signed)
Charles, RN with [facility] reports patients has a lot of family issues with possibly the family upsetting the patient. In the end, patient will need permanent placement.

## 2017-05-01 NOTE — ED Triage Notes (Signed)
Patient is from home and transported via [facility]. Patient has lung cancer with mets to the brain. For the last four days he has been paranoid, violent, and confused. Also, he is a hospice patient that has been treated with Seroquel to sedate. Patients daughter administered one dose at 16:00 and another dose at 19:00. Furthermore, he has not slept in 3 days, no nausea, vomiting, diarrhea, or UTI symptoms.

## 2017-05-01 NOTE — ED Notes (Signed)
Juanda Crumble, RN with [facility] [phone]

## 2017-05-02 DIAGNOSIS — C7931 Secondary malignant neoplasm of brain: Secondary | ICD-10-CM | POA: Diagnosis not present

## 2017-05-02 LAB — BASIC METABOLIC PANEL
ANION GAP: 7 (ref 5–15)
BUN: 24 mg/dL — ABNORMAL HIGH (ref 6–20)
CHLORIDE: 104 mmol/L (ref 101–111)
CO2: 29 mmol/L (ref 22–32)
CREATININE: 1.11 mg/dL (ref 0.61–1.24)
Calcium: 9.1 mg/dL (ref 8.9–10.3)
GFR calc non Af Amer: 56 mL/min — ABNORMAL LOW (ref >60)
Glucose, Bld: 97 mg/dL (ref 65–99)
Potassium: 4.4 mmol/L (ref 3.5–5.1)
SODIUM: 140 mmol/L (ref 135–145)

## 2017-05-02 LAB — CBC
HCT: 41.1 % (ref 39.0–52.0)
Hemoglobin: 13.6 g/dL (ref 13.0–17.0)
MCH: 30.8 pg (ref 26.0–34.0)
MCHC: 33.1 g/dL (ref 30.0–36.0)
MCV: 93.2 fL (ref 78.0–100.0)
Platelets: 211 K/uL (ref 150–400)
RBC: 4.41 MIL/uL (ref 4.22–5.81)
RDW: 15.1 % (ref 11.5–15.5)
WBC: 5.1 K/uL (ref 4.0–10.5)

## 2017-05-02 LAB — URINALYSIS, DIPSTICK ONLY
Bilirubin Urine: NEGATIVE
Glucose, UA: NEGATIVE mg/dL
Hgb urine dipstick: NEGATIVE
Ketones, ur: 5 mg/dL — AB
Leukocytes, UA: NEGATIVE
Nitrite: NEGATIVE
Protein, ur: NEGATIVE mg/dL
Specific Gravity, Urine: 1.021 (ref 1.005–1.030)
pH: 5 (ref 5.0–8.0)

## 2017-05-02 MED ORDER — CLONAZEPAM 0.5 MG PO TABS: 0.2500 mg | ORAL_TABLET | Freq: Four times a day (QID) | ORAL | Status: DC | PRN

## 2017-05-02 MED ORDER — QUETIAPINE FUMARATE 50 MG PO TABS
50.0000 mg | ORAL_TABLET | Freq: Two times a day (BID) | ORAL | Status: DC
  Administered 2017-05-02: 50 mg via ORAL
  Filled 2017-05-02: qty 1

## 2017-05-02 MED ORDER — MORPHINE SULFATE (CONCENTRATE) 10 MG /0.5 ML PO SOLN
10.0000 mg | Freq: Two times a day (BID) | ORAL | Status: DC
  Administered 2017-05-02 (×2): 10 mg via ORAL
  Filled 2017-05-02 (×4): qty 0.5

## 2017-05-02 MED ORDER — LORAZEPAM 2 MG/ML PO CONC
2.0000 mg | ORAL | Status: DC | PRN
  Filled 2017-05-02: qty 1

## 2017-05-02 MED ORDER — HYDROCODONE-ACETAMINOPHEN 5-325 MG PO TABS: 1.0000 | ORAL_TABLET | ORAL | Status: DC | PRN

## 2017-05-02 MED ORDER — HYDRALAZINE HCL 25 MG PO TABS
25.0000 mg | ORAL_TABLET | Freq: Two times a day (BID) | ORAL | Status: DC
  Administered 2017-05-02 (×2): 25 mg via ORAL
  Filled 2017-05-02 (×3): qty 1

## 2017-05-02 MED ORDER — BUPROPION HCL ER (SR) 150 MG PO TB12
150.0000 mg | ORAL_TABLET | Freq: Every day | ORAL | Status: DC
  Administered 2017-05-02: 150 mg via ORAL
  Filled 2017-05-02: qty 1

## 2017-05-02 NOTE — Care Management Note (Addendum)
Case Management Note  CM was contacted by Burman Nieves with [facility] in regards to pt with both hospice and LTC options though the [place] not through traditional insurance.  Advised pt is eligible for both benefits and emailed a copy of the referral packet to CM and CSW.  She stated they contract with 5 facilities and 4 of them are currently accepting pts at this time for LTC.  She also states that the pt can come to their hospice facility earlier than regular hospices require.  Asked for the referral packet to be filled out and faxed to [facility] for screening.  Updated CSW.  CM will follow as needed.

## 2017-05-02 NOTE — ED Provider Notes (Addendum)
Pt is sleeping this am.  Vital signs are stable.   Waiting for social work evaluation for inpatient hospice placement.   Dorie Rank, MD 05/02/17 [phone]  Plan is for respite care.  Pt is not on hospice.  Does not meet inpatient hospice criteria.  Social work attempting to arrange placement today.   Dorie Rank, MD 05/02/17 [phone]  Spoke with pt's son.  He is very concerned.  Sister cannot care for him at home.  Still attempting to arrange placement.  No labs were ordered earlier.  I will check a CBC and bmet.  It might assist in getting him placed.  Ultimately he is hospice and aggressive treatment is not indicated.    Dorie Rank, MD 05/02/17 1356

## 2017-05-02 NOTE — ED Provider Notes (Signed)
Pt's family (son) is here and would ilke to take patient home. States that he will continue to pursue intervention through the [place]. Requests copies of the patient's tests from today.   Tanna Furry, MD 05/02/17 [phone]

## 2017-05-02 NOTE — Progress Notes (Signed)
CSW aware of consult- will reach out to [facility] and patient hospice nurse, Jaquelyn Bitter [phone]  Kingsley Spittle, [facility] Emergency Room Clinical Social Worker [phone]

## 2017-05-02 NOTE — Progress Notes (Signed)
CSW spoke with patients son, Koehn Salehi [phone], who currently lives in [place] and helps with patients care/ finances. CSW informed son of current discharge barriers and need for assistance with discharge plans. Son voiced frustration with patient not being admitted to hospital and patient needing additional care. CSW informed son of [facility]'s involvement and assistance with finding patient respite care. CSW did inform son and daughter, [name], that APS report would be completed if family was unable to pick patient up and facility was unable to be located.   At this time, CSW is completing application's for [facility] hospice/ long-term care. Patient does have VA benefits that would qualify him for these facilities. If accepted to a [place] facility- this would be a hospital- to-hospital transfer and will need the sending/ receiving MD's to confirm transfer.   Kingsley Spittle, [facility] Emergency Room Clinical Social Worker [phone]

## 2017-05-02 NOTE — Discharge Instructions (Signed)
Continue current medications, without changes. Follow-up with patient's physicians as scheduled.
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
## 2017-05-02 NOTE — Progress Notes (Addendum)
2nd shift ED CSW received handoff from 1st shift ED CSW; Pt is a veteran with cancer who is possibly apporpriate for placement in a community or hospice [facility] facility.  CSW began app process 2nd shift was continuing application.  2nd shift ED CSW staffed with Asst Director and Director and was told pt must go with sister and utilize family support while applying for [place] services.  CSW received phone call from pt's other son Ronalee Belts who had arrived to the [facility] ED to transport pt directly to the [facility] 24 hour care facility/ED for assistance with the pt through the [place].  CSW consulted with EDP who stated pt was cleared for D/C and spoke to family and facilitated D/C.  EDP will provide pt's labs for [facility] and will provide to RN for D/C.  CSW will update RN and pt will be D/C'd to care of pt's son Ronalee Belts.  Ronalee Belts is in agreement with this plan and pt is not A&OX4.  Please reconsult if future social work needs arise.  CSW signing off, as social work intervention is no longer needed.  Alphonse Guild. Bhavesh Vazquez, LCSW, LCAS, CSI Clinical Social Worker Ph: [phone]

## 2017-05-02 NOTE — ED Notes (Signed)
When entering the room, patient had removed his gown and blankets. Redressed himself to be fully clothed. Removed patients jacket and placed him back in a gown. Obtained vital signs and [place], NT provided him soup to eat. Blanket provided.

## 2017-05-02 NOTE — Clinical Social Work Note (Signed)
Clinical Social Work Assessment  Patient Details  Name: Carlos Bennett MRN: [id number] Date of Birth: 06-22-1923  Date of referral:  05/02/17               Reason for consult:  Discharge Planning                Permission sought to share information with:    Permission granted to share information::     Name::        Agency::     Relationship::     Contact Information:     Housing/Transportation Living arrangements for the past 2 months:  Single Family Home(Living with family ) Source of Information:  Adult Children(Annette ) Patient Interpreter Needed:  None Criminal Activity/Legal Involvement Pertinent to Current Situation/Hospitalization:    Significant Relationships:  Adult Children Lives with:  Adult Children Do you feel safe going back to the place where you live?  No Need for family participation in patient care:  Yes (Comment)  Care giving concerns:  Patient brought in VIA ems and is cancer patient. Family is requesting respite placement.    Social Worker assessment / plan:  CSW spoke with patients daughter, Harriette Bouillon [phone], regarding discharge plans. Patient currently lives with his daughter however has been having increased confusion. Patient was recently found "running around outside"- daughter states she can not take care of him any longer. Daughter states she is "unable to sleep at night because I have to watch him". Daughter provided CSW with phone number to [facility] which has been providing patient with home health care for 2 months. Per [facility], patient does not meet residential hospice at this time however they are seeking respite care for family. [facility] will pay for 5 days of respite care for patient while seeking long-term placement options.   [facility] is seeking respite care for patient at [facility] and other SNF's. CSW will follow up with [facility] to assist with respite care.   Employment status:   Retired Forensic scientist:  Medicare PT Recommendations:  Not assessed at this time Information / Referral to community resources:     Patient/Family's Response to care:  Daughter appreciated CSW.    Patient/Family's Understanding of and Emotional Response to Diagnosis, Current Treatment, and Prognosis:  Unknown at this time.   Emotional Assessment Appearance:  Appears stated age Attitude/Demeanor/Rapport:    Affect (typically observed):    Orientation:    Alcohol / Substance use:    Psych involvement (Current and /or in the community):  No (Comment)  Discharge Needs  Concerns to be addressed:  Discharge Planning Concerns Readmission within the last 30 days:  No Current discharge risk:  Chronically ill Barriers to Discharge:  Other(Waiting for respite beds)   Weston Anna, LCSW 05/02/2017, 9:58 AM

## 2017-07-18 DEATH — deceased

## 2019-08-28 IMAGING — PT NM PET TUM IMG INITIAL (PI) SKULL BASE T - THIGH
7 of 8 series · 23 of 25 positions shown · non-contrast
Comparison: CT examinations from [place] [HOSPITAL] dated
01/31/2017 and 01/29/2017.

CLINICAL DATA: Subsequent treatment strategy for metastatic lung
cancer.

EXAM:
NUCLEAR MEDICINE PET SKULL BASE TO THIGH
TECHNIQUE: 6.8 mCi F-18 FDG was injected intravenously. Full-ring PET imaging
was performed from the skull base to thigh after the radiotracer. CT
data was obtained and used for attenuation correction and anatomic
localization.
FASTING BLOOD GLUCOSE:  Value: 110 mg/dl

[Series 3: pet sk_thigh ac · axial · 5.0mm · 4.07mm/px · z∈[-1008,-132]mm · 4 of 220 slices shown]
[im 1/220]
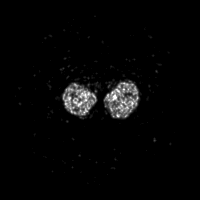
[im 74/220]
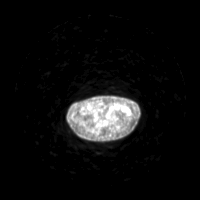
[im 147/220]
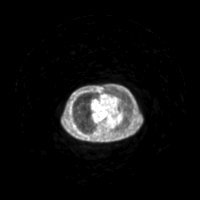
[im 220/220]
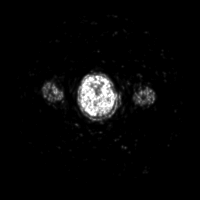

[Series 4: ct sk_thigh 5.0 b31f · axial · 5.0mm · 0.98mm/px · z∈[-1008,-132]mm · 4 of 219 slices shown]
[im 1/219]
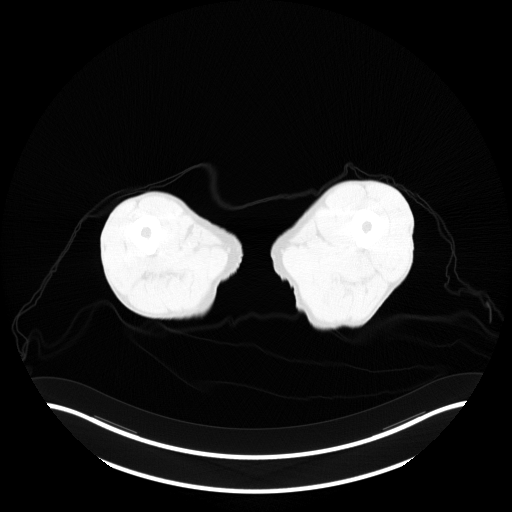
[im 55/219]
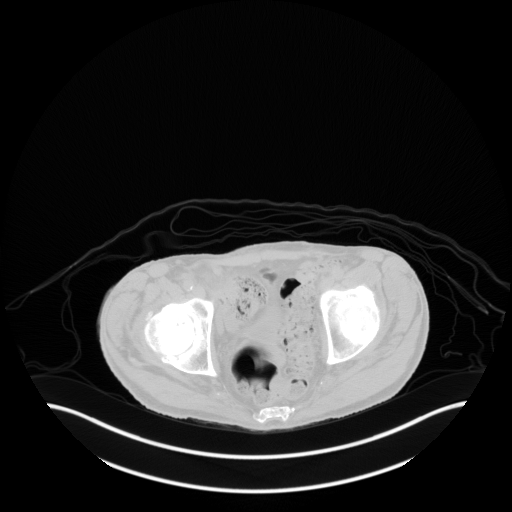
[im 164/219]
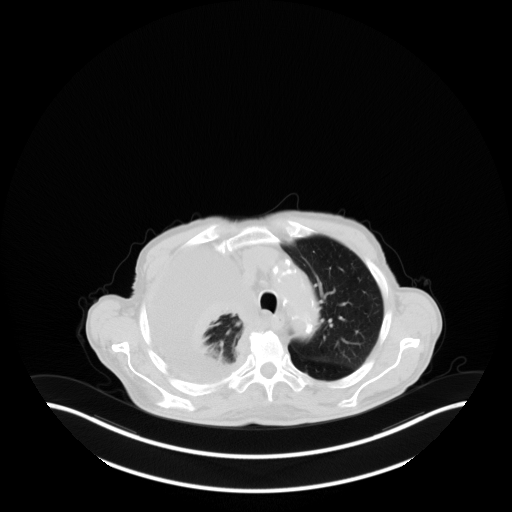
[im 219/219  brain]
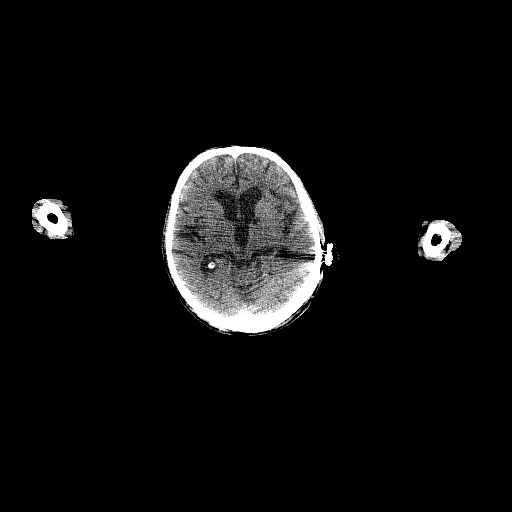

[Series 5: pet sk_thigh nac · axial · 5.0mm · 4.07mm/px · z∈[-1008,-132]mm · 5 of 220 slices shown]
[im 1/220]
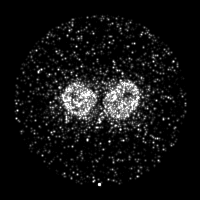
[im 55/220]
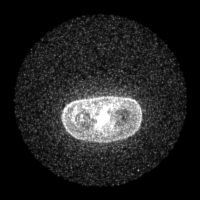
[im 110/220]
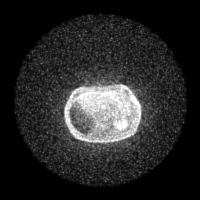
[im 165/220]
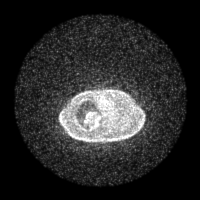
[im 220/220]
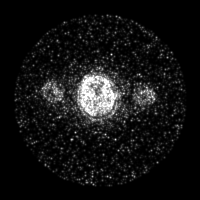

[Series 8: ct sk_thigh 5.0 b70f (id)_bone · axial · 5.0mm · 0.69mm/px · z∈[-554,-274]mm · 2 of 71 slices shown]
[im 1/71  bone]
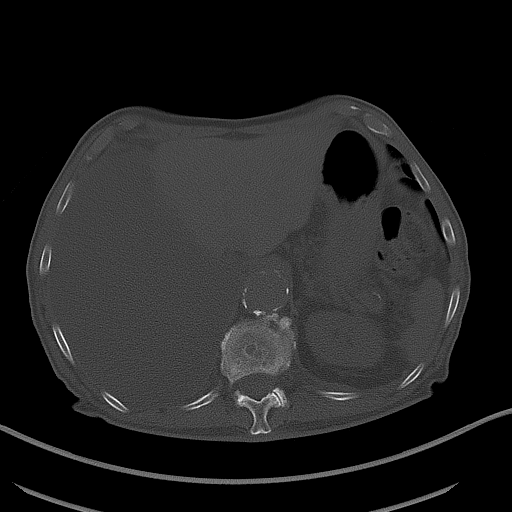
[im 71/71  bone]
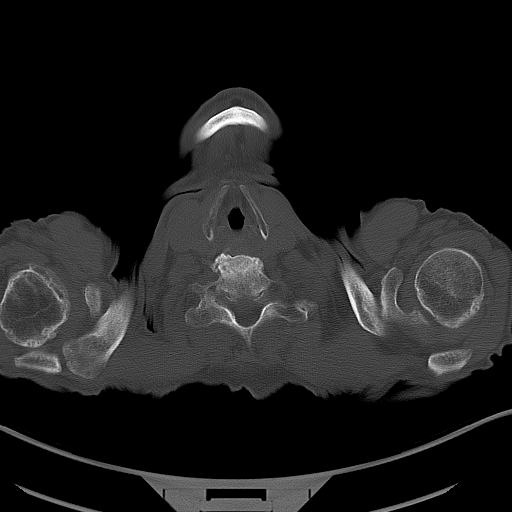

[Series 603: range-ct sk_thigh 5.0 (id)<alpha range> · 2 of 68 slices shown (1 of 2)]
[im 1/68]
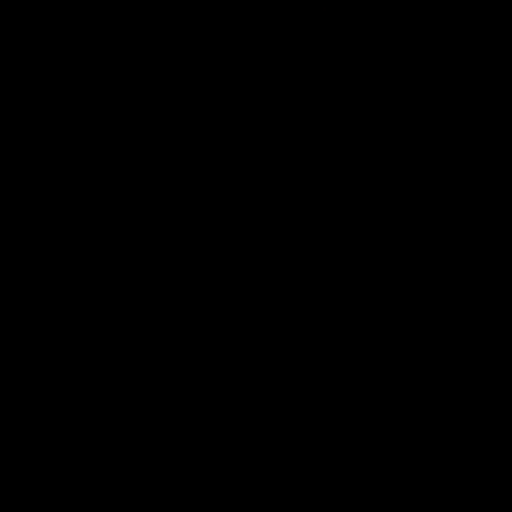
[im 68/68]
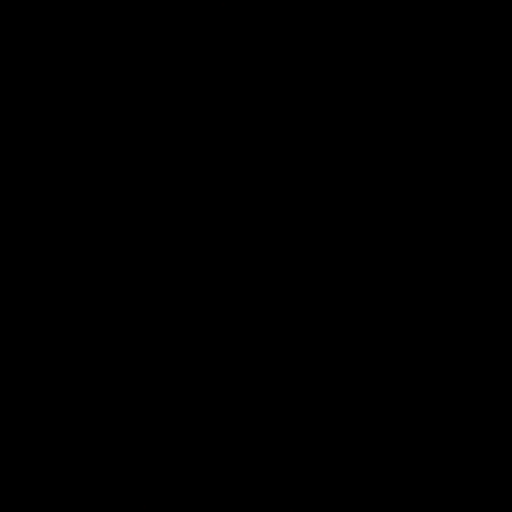

[Series 605: range-ct sk_thigh 5.0 (id)<alpha range> · 5 of 204 slices shown (2 of 2)]
[im 1/204]
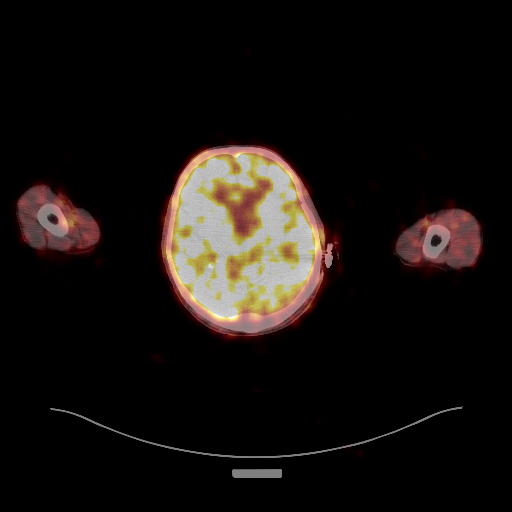
[im 51/204]
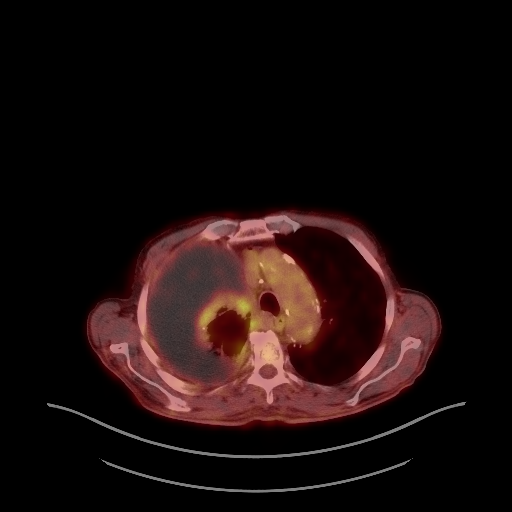
[im 102/204]
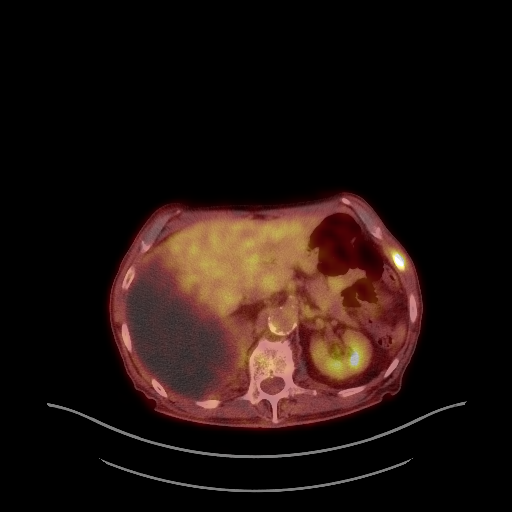
[im 153/204]
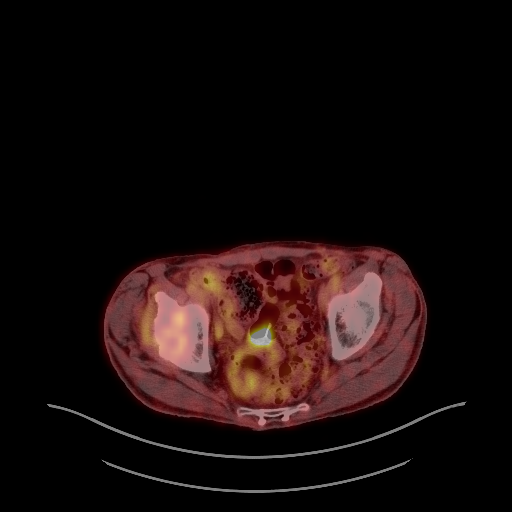
[im 204/204]
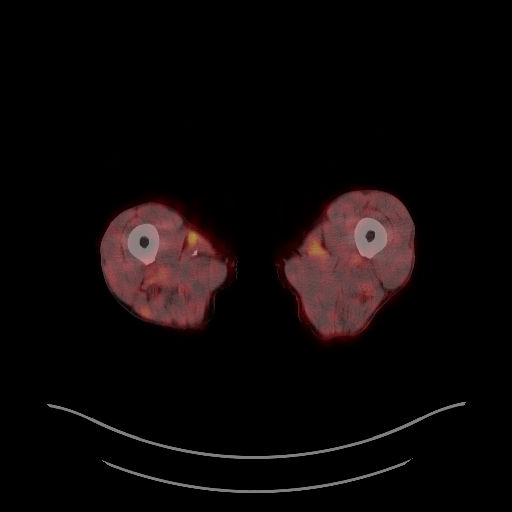

[Series 1032: results mm oncology reading · 5.0mm · 0.74mm/px · 1 of 5 slices shown]
[im 1/5]
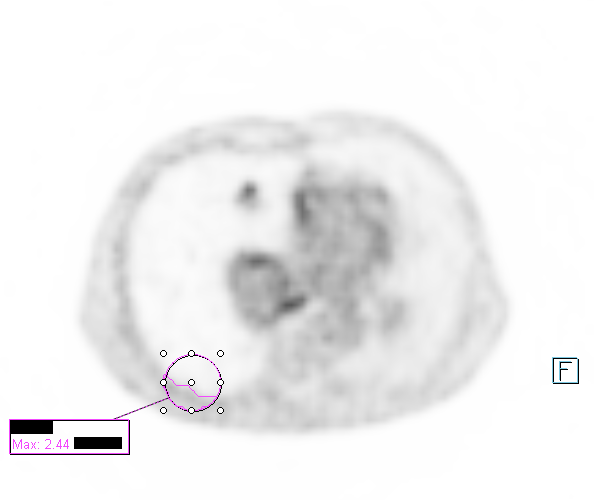

[23 of 25 positions shown; findings below may reference images not displayed]

FINDINGS: NECK

No hypermetabolic lymph nodes in the neck. Anterior left thalamic
lacunar infarct noted. Carotid atherosclerotic calcifications.
Incidental failure of fusion of the posterior arch of C1.

CHEST

Large right pleural effusion with complete collapse of the right
middle lobe and right lower lobe, and nearly complete collapse of
the right upper lobe. There is mildly accentuated metabolic activity
along the right pleural effusion compatible with malignant effusion,
representative metabolic activity along the pleural margin with SUV
approximately 2.4, pleural or adjacent parenchymal tumor along the
right upper lobe medially has maximum standard uptake value of 7.3.

There foci of tumor occludes the presence in the atelectatic
portions of the right upper lobe, right middle lobe, and right lower
lobe. Dominant involvement medially in the right middle lobe has a
maximum SUV of 13.2 and measures about 3.2 cm in long axis. Given
the tumor adjacent to the right hilum, a component of right hilar
involvement is not excluded.

Small calcified nodules in the left lung are compatible with old
granulomatous disease and are not hypermetabolic. 3 mm left upper
lobe nodule on image [DATE] is not calcified and has no measurable
hypermetabolic activity.

Coronary, aortic arch, and branch vessel atherosclerotic vascular
disease. Calcified hilar and mediastinal lymph nodes are likely from
old granulomatous disease. Shift of cardiac and mediastinal
structures to the left.

ABDOMEN/PELVIS

8 mm right adrenal nodule on image 114/4 has faintly accentuated
metabolic activity with maximum SUV 3.8, early metastatic disease
not excluded.

Aortoiliac atherosclerotic vascular disease. Infrarenal abdominal
aortic aneurysm 3.1 cm in diameter on image 132/4. Sigmoid colon
diverticulosis. Prominent stool throughout the colon favors
constipation. Prostatomegaly.

SKELETON

Scattered hypermetabolic lytic lesions involving the thoracic spine,
lumbar spine, ribs, right iliac bone, left iliac bone, right sacrum,
and right superior pubic ramus. The dominant lesion at the T10
vertebral level destroys the posterior aspect of the vertebral body
and appears to be invading the epidural space, cord compression not
excluded. Similarly at T11 the posterior vertebral cortex is
destroyed and there could be early epidural invasion. At T10 the
maximum SUV is 15.2.
IMPRESSION: 1. Malignant right pleural effusion with scattered hypermetabolic
lesions in all 3 lobes of the right lung, as well as scattered
osseous metastatic lesions. Osseous metastatic lesions at T10 and
T11 appear to be invading through the posterior vertebral body and
into the epidural space can't could cause cord compression- thoracic
spine MRI with and without contrast should be strongly considered
for definitive characterization. Most of the right lung is
collapsed, except for a small portion of the right upper lobe.
2. Small faintly hypermetabolic nodule in the right adrenal gland
may represent a metastatic nodule.
3. Large right pleural effusion causes displacement of mediastinal
and cardiac structures to the left.
4. 3.1 cm infrarenal abdominal aortic aneurysm. Recommend followup
by US in 3 years. This recommendation follows ACR consensus
guidelines: White Paper of the ACR Incidental Findings Committee II
on Vascular Findings. [HOSPITAL] 0280; [DATE]
5. Other imaging findings of potential clinical significance: Aortic
Atherosclerosis ([id number].O). Coronary atherosclerosis. Sigmoid
colon diverticulosis. Prominent stool throughout the colon favors
constipation. Prostatomegaly.
